# Patient Record
Sex: Male | Born: 1990 | Race: White | Hispanic: No | Marital: Single | Smoking: Never smoker
Health system: Southern US, Community
[De-identification: ages and names within clinical notes are randomized; demographics above are authoritative.]

## PROBLEM LIST (undated history)

## (undated) DIAGNOSIS — K3580 Unspecified acute appendicitis: Secondary | ICD-10-CM

## (undated) DIAGNOSIS — S42001A Fracture of unspecified part of right clavicle, initial encounter for closed fracture: Secondary | ICD-10-CM

---
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
## 2013-09-24 ENCOUNTER — Ambulatory Visit (INDEPENDENT_AMBULATORY_CARE_PROVIDER_SITE_OTHER): Payer: BC Managed Care – PPO | Admitting: Physician Assistant

## 2013-09-24 ENCOUNTER — Encounter: Payer: Self-pay | Admitting: Physician Assistant

## 2013-09-24 VITALS — BP 100/78 | HR 72 | Temp 98.0°F | Resp 18 | Wt 291.0 lb

## 2013-09-24 DIAGNOSIS — R109 Unspecified abdominal pain: Secondary | ICD-10-CM

## 2013-09-24 LAB — BASIC METABOLIC PANEL
BUN: 9 mg/dL (ref 6–23)
CO2: 29 mEq/L (ref 19–32)
Calcium: 9.7 mg/dL (ref 8.4–10.5)
Chloride: 101 mEq/L (ref 96–112)
Creatinine, Ser: 0.6 mg/dL (ref 0.4–1.5)
GFR: 175.49 mL/min (ref >60.00)
Glucose, Bld: 95 mg/dL (ref 70–99)
Potassium: 4.9 mEq/L (ref 3.5–5.1)
Sodium: 138 mEq/L (ref 135–145)

## 2013-09-24 LAB — CBC WITH DIFFERENTIAL/PLATELET
Basophils Absolute: 0.1 10*3/uL (ref 0.0–0.1)
Basophils Relative: 0.8 % (ref 0.0–3.0)
Eosinophils Absolute: 0.3 10*3/uL (ref 0.0–0.7)
Eosinophils Relative: 3.5 % (ref 0.0–5.0)
HCT: 46.1 % (ref 39.0–52.0)
Hemoglobin: 15.4 g/dL (ref 13.0–17.0)
Lymphocytes Relative: 20.4 % (ref 12.0–46.0)
Lymphs Abs: 1.8 10*3/uL (ref 0.7–4.0)
MCHC: 33.4 g/dL (ref 30.0–36.0)
MCV: 92.3 fl (ref 78.0–100.0)
Monocytes Absolute: 1 10*3/uL (ref 0.1–1.0)
Monocytes Relative: 11.3 % (ref 3.0–12.0)
Neutro Abs: 5.7 10*3/uL (ref 1.4–7.7)
Neutrophils Relative %: 64 % (ref 43.0–77.0)
Platelets: 386 10*3/uL (ref 150.0–400.0)
RBC: 4.99 Mil/uL (ref 4.22–5.81)
RDW: 13.4 % (ref 11.5–15.5)
WBC: 9 10*3/uL (ref 4.0–10.5)

## 2013-09-24 LAB — POCT URINALYSIS DIPSTICK
Bilirubin, UA: NEGATIVE
Blood, UA: NEGATIVE
Glucose, UA: NEGATIVE
Ketones, UA: NEGATIVE
Leukocytes, UA: NEGATIVE
Nitrite, UA: NEGATIVE
Spec Grav, UA: 1.025
Urobilinogen, UA: 0.2
pH, UA: 7

## 2013-09-24 LAB — SEDIMENTATION RATE: Sed Rate: 28 mm/hr — ABNORMAL HIGH (ref 0–22)

## 2013-09-24 NOTE — Progress Notes (Signed)
Pre visit review using our clinic review tool, if applicable. No additional management support is needed unless otherwise documented below in the visit note. 

## 2013-09-24 NOTE — Patient Instructions (Signed)
We will call with the results of your labwork when available.  Maintain fluid hydration.  Try a bland diet with bananas, rice, applesauce, toast. This will help cool off your abdomen and hopefully improve her diarrhea symptoms.  If emergency symptoms discussed during visit developed, seek medical attention immediately.  Followup as needed, or for worsening or persistent symptoms despite treatment.    Food Choices to Help Relieve Diarrhea When you have diarrhea, the foods you eat and your eating habits are very important. Choosing the right foods and drinks can help relieve diarrhea. Also, because diarrhea can last up to 7 days, you need to replace lost fluids and electrolytes (such as sodium, potassium, and chloride) in order to help prevent dehydration.  WHAT GENERAL GUIDELINES DO I NEED TO FOLLOW?  Slowly drink 1 cup (8 oz) of fluid for each episode of diarrhea. If you are getting enough fluid, your urine will be clear or pale yellow.  Eat starchy foods. Some good choices include white rice, white toast, pasta, low-fiber cereal, baked potatoes (without the skin), saltine crackers, and bagels.  Avoid large servings of any cooked vegetables.  Limit fruit to two servings per day. A serving is  cup or 1 small piece.  Choose foods with less than 2 g of fiber per serving.  Limit fats to less than 8 tsp (38 g) per day.  Avoid fried foods.  Eat foods that have probiotics in them. Probiotics can be found in certain dairy products.  Avoid foods and beverages that may increase the speed at which food moves through the stomach and intestines (gastrointestinal tract). Things to avoid include:  High-fiber foods, such as dried fruit, raw fruits and vegetables, nuts, seeds, and whole grain foods.  Spicy foods and high-fat foods.  Foods and beverages sweetened with high-fructose corn syrup, honey, or sugar alcohols such as xylitol, sorbitol, and mannitol. WHAT FOODS ARE  RECOMMENDED? Grains White rice. White, [place], or pita breads (fresh or toasted), including plain rolls, buns, or bagels. White pasta. Saltine, soda, or graham crackers. Pretzels. Low-fiber cereal. Cooked cereals made with water (such as cornmeal, farina, or cream cereals). Plain muffins. Matzo. Melba toast. Zwieback.  Vegetables Potatoes (without the skin). Strained tomato and vegetable juices. Most well-cooked and canned vegetables without seeds. Tender lettuce. Fruits Cooked or canned applesauce, apricots, cherries, fruit cocktail, grapefruit, peaches, pears, or plums. Fresh bananas, apples without skin, cherries, grapes, cantaloupe, grapefruit, peaches, oranges, or plums.  Meat and Other Protein Products Baked or boiled chicken. Eggs. Tofu. Fish. Seafood. Smooth peanut butter. Ground or well-cooked tender beef, ham, veal, lamb, pork, or poultry.  Dairy Plain yogurt, kefir, and unsweetened liquid yogurt. Lactose-free milk, buttermilk, or soy milk. Plain hard cheese. Beverages Sport drinks. Clear broths. Diluted fruit juices (except prune). Regular, caffeine-free sodas such as ginger ale. Water. Decaffeinated teas. Oral rehydration solutions. Sugar-free beverages not sweetened with sugar alcohols. Other Bouillon, broth, or soups made from recommended foods.  The items listed above may not be a complete list of recommended foods or beverages. Contact your dietitian for more options. WHAT FOODS ARE NOT RECOMMENDED? Grains Whole grain, whole wheat, bran, or rye breads, rolls, pastas, crackers, and cereals. Wild or brown rice. Cereals that contain more than 2 g of fiber per serving. Corn tortillas or taco shells. Cooked or dry oatmeal. Granola. Popcorn. Vegetables Raw vegetables. Cabbage, broccoli, Brussels sprouts, artichokes, baked beans, beet greens, corn, kale, legumes, peas, sweet potatoes, and yams. Potato skins. Cooked spinach and cabbage. Fruits Dried fruit,  including raisins and dates.  Raw fruits. Stewed or dried prunes. Fresh apples with skin, apricots, mangoes, pears, raspberries, and strawberries.  Meat and Other Protein Products Chunky peanut butter. Nuts and seeds. Beans and lentils. Berniece Salines.  Dairy High-fat cheeses. Milk, chocolate milk, and beverages made with milk, such as milk shakes. Cream. Ice cream. Sweets and Desserts Sweet rolls, doughnuts, and sweet breads. Pancakes and waffles. Fats and Oils Butter. Cream sauces. Margarine. Salad oils. Plain salad dressings. Olives. Avocados.  Beverages Caffeinated beverages (such as coffee, tea, soda, or energy drinks). Alcoholic beverages. Fruit juices with pulp. Prune juice. Soft drinks sweetened with high-fructose corn syrup or sugar alcohols. Other Coconut. Hot sauce. Chili powder. Mayonnaise. Gravy. Cream-based or milk-based soups.  The items listed above may not be a complete list of foods and beverages to avoid. Contact your dietitian for more information. WHAT SHOULD I DO IF I BECOME DEHYDRATED? Diarrhea can sometimes lead to dehydration. Signs of dehydration include dark urine and dry mouth and skin. If you think you are dehydrated, you should rehydrate with an oral rehydration solution. These solutions can be purchased at pharmacies, retail stores, or online.  Drink -1 cup (120-240 mL) of oral rehydration solution each time you have an episode of diarrhea. If drinking this amount makes your diarrhea worse, try drinking smaller amounts more often. For example, drink 1-3 tsp (5-15 mL) every 5-10 minutes.  A general rule for staying hydrated is to drink 1-2 L of fluid per day. Talk to your health care provider about the specific amount you should be drinking each day. Drink enough fluids to keep your urine clear or pale yellow. Document Released: 05/27/2003 Document Revised: 03/11/2013 Document Reviewed: 01/27/2013 [facility] Patient Information 2015 [place], [place]. This information is not intended to replace advice given  to you by your health care provider. Make sure you discuss any questions you have with your health care provider. Abdominal Pain Many things can cause belly (abdominal) pain. Most times, the belly pain is not dangerous. Many cases of belly pain can be watched and treated at home. HOME CARE   Do not take medicines that help you go poop (laxatives) unless told to by your doctor.  Only take medicine as told by your doctor.  Eat or drink as told by your doctor. Your doctor will tell you if you should be on a special diet. GET HELP IF:  You do not know what is causing your belly pain.  You have belly pain while you are sick to your stomach (nauseous) or have runny poop (diarrhea).  You have pain while you pee or poop.  Your belly pain wakes you up at night.  You have belly pain that gets worse or better when you eat.  You have belly pain that gets worse when you eat fatty foods.  You have a fever. GET HELP RIGHT AWAY IF:   The pain does not go away within 2 hours.  You keep throwing up (vomiting).  The pain changes and is only in the right or left part of the belly.  You have bloody or tarry looking poop. MAKE SURE YOU:   Understand these instructions.  Will watch your condition.  Will get help right away if you are not doing well or get worse. Document Released: 08/23/2007 Document Revised: 03/11/2013 Document Reviewed: 11/13/2012 [facility] Patient Information 2015 [place], [place]. This information is not intended to replace advice given to you by your health care provider. Make sure you discuss any questions you  have with your health care provider.  

## 2013-09-24 NOTE — Progress Notes (Signed)
Subjective:    Patient ID: Jesse Underwood, male    DOB: 11/24/1990, 23 y.o.   MRN: [id number]  Abdominal Pain This is a new problem. The current episode started in the past 7 days (3 or 4 days). The onset quality is sudden. The problem occurs 2 to 4 times per day. The problem has been gradually worsening. The pain is located in the RLQ. The pain is at a severity of 4/10. The quality of the pain is dull, aching and cramping. The abdominal pain does not radiate. Associated symptoms include diarrhea and myalgias. Pertinent negatives include no anorexia, arthralgias, belching, constipation, dysuria, fever, flatus, frequency, headaches, hematochezia, hematuria, melena, nausea, vomiting or weight loss. The pain is aggravated by movement and certain positions. The pain is relieved by palpation, certain positions and standing. Treatments tried: peptobismol. The treatment provided no relief. There is no history of abdominal surgery, colon cancer, Crohn's disease, gallstones, GERD, irritable bowel syndrome, pancreatitis, PUD or ulcerative colitis.  The pain is new, however he states that he has noticed a "bulge" in the area for several months.    Review of Systems  Constitutional: Negative for fever, chills and weight loss.  Respiratory: Negative for cough and shortness of breath.   Cardiovascular: Negative for chest pain.  Gastrointestinal: Positive for abdominal pain and diarrhea. Negative for nausea, vomiting, constipation, blood in stool, melena, hematochezia, abdominal distention, anal bleeding, anorexia and flatus.  Genitourinary: Negative for dysuria, frequency and hematuria.  Musculoskeletal: Positive for myalgias. Negative for arthralgias.  Neurological: Negative for headaches.  All other systems reviewed and are negative.   History reviewed. No pertinent past medical history.  History   Social History  . Marital Status: Single    Spouse Name: N/A    Number of Children: N/A  . Years of  Education: N/A   Occupational History  . Not on file.   Social History Main Topics  . Smoking status: Never Smoker   . Smokeless tobacco: Not on file  . Alcohol Use: No  . Drug Use: Not on file  . Sexual Activity: Not on file   Other Topics Concern  . Not on file   Social History Narrative  . No narrative on file    History reviewed. No pertinent past surgical history.  No family history on file.  No Known Allergies  No current outpatient prescriptions on file prior to visit.   No current facility-administered medications on file prior to visit.    EXAM: BP 100/78  Pulse 72  Temp(Src) 98 F (36.7 C) (Oral)  Resp 18  Wt 291 lb (131.997 kg)     Objective:   Physical Exam  Nursing note and vitals reviewed. Constitutional: He is oriented to person, place, and time. He appears well-developed and well-nourished. No distress.  HENT:  Head: Normocephalic and atraumatic.  Eyes: Conjunctivae and EOM are normal. Pupils are equal, round, and reactive to light.  Neck: Normal range of motion.  Cardiovascular: Normal rate, regular rhythm and intact distal pulses.   Pulmonary/Chest: Effort normal and breath sounds normal. No respiratory distress. He exhibits no tenderness.  Abdominal: Soft. Bowel sounds are normal. He exhibits no distension and no mass. There is tenderness (vague mild tenderness to deep palpation of the RLQ.). There is no rebound and no guarding.  There is a superficial varicosity in the RLQ, however not over the location of pain. This is not tender to palpation.  Genitourinary:  No direct or indirect inguinal hernia on exam.  Musculoskeletal: Normal range of motion.  Neurological: He is alert and oriented to person, place, and time.  Skin: Skin is warm and dry. No rash noted. He is not diaphoretic. No erythema. No pallor.  There is a superficial varicosity in the RLQ, however not over the location of pain. This is not tender to palpation, no erythema,  swelling, excessive warmth.  Psychiatric: He has a normal mood and affect. His behavior is normal. Judgment and thought content normal.       Lab Results  Component Value Date   WBC 8.8 09/13/2009   HGB 16.9 09/13/2009   HCT 47.9 09/13/2009   PLT 345.0 09/13/2009   GLUCOSE 72 09/13/2009   CHOL 180 09/13/2009   TRIG 103.0 09/13/2009   HDL 33.00* 09/13/2009   LDLCALC 126* 09/13/2009   ALT 99* 09/13/2009   AST 39* 09/13/2009   NA 143 09/13/2009   K 4.7 09/13/2009   CL 107 09/13/2009   CREATININE 0.7 09/13/2009   BUN 11 09/13/2009   CO2 28 09/13/2009   TSH 1.27 09/13/2009        Assessment & Plan:  Jahmal was seen today for right lower abdominal pain.  Diagnoses and associated orders for this visit:  Abdominal pain, other specified site Comments: RLQ. No peritoneal signs. Will draw labs and watchful waiting for now. - CBC with Differential - Sedimentation Rate - POC Urinalysis Dipstick - Basic Metabolic Panel    Watchful waiting. Will have pt use symptomatic at home therapy and bland diet to calm GI symptoms. Maintain fluid hydration.  The "bulge" that the pt was referring to was actually the varicosity, and was not located in the area of the tenderness. Pt will continue to monitor.  Return precautions provided, and patient handout on diet for diarrhea, abdominal pain.  Plan to follow up as needed, or for worsening or persistent symptoms despite treatment.  Patient Instructions  We will call with the results of your labwork when available.  Maintain fluid hydration.  Try a bland diet with bananas, rice, applesauce, toast. This will help cool off your abdomen and hopefully improve her diarrhea symptoms.  If emergency symptoms discussed during visit developed, seek medical attention immediately.  Followup as needed, or for worsening or persistent symptoms despite treatment.

## 2013-10-01 ENCOUNTER — Ambulatory Visit (INDEPENDENT_AMBULATORY_CARE_PROVIDER_SITE_OTHER): Payer: BC Managed Care – PPO | Admitting: Family Medicine

## 2013-10-01 ENCOUNTER — Ambulatory Visit (INDEPENDENT_AMBULATORY_CARE_PROVIDER_SITE_OTHER)
Admission: RE | Admit: 2013-10-01 | Discharge: 2013-10-01 | Disposition: A | Discharge status: Home / Self Care | Payer: BC Managed Care – PPO | Source: Ambulatory Visit | Attending: Family Medicine | Admitting: Family Medicine

## 2013-10-01 ENCOUNTER — Ambulatory Visit: Payer: Self-pay | Admitting: Physician Assistant

## 2013-10-01 ENCOUNTER — Encounter: Payer: Self-pay | Admitting: Family Medicine

## 2013-10-01 ENCOUNTER — Observation Stay (HOSPITAL_COMMUNITY)
Admission: EM | Admit: 2013-10-01 | Discharge: 2013-10-03 | Disposition: A | Discharge status: Home / Self Care | Payer: BC Managed Care – PPO | Attending: Surgery | Admitting: Surgery

## 2013-10-01 ENCOUNTER — Encounter (HOSPITAL_COMMUNITY): Payer: Self-pay | Admitting: Emergency Medicine

## 2013-10-01 VITALS — BP 114/81 | HR 79 | Temp 98.2°F | Ht 67.0 in | Wt 289.0 lb

## 2013-10-01 DIAGNOSIS — Z6841 Body Mass Index (BMI) 40.0 and over, adult: Secondary | ICD-10-CM | POA: Insufficient documentation

## 2013-10-01 DIAGNOSIS — R1031 Right lower quadrant pain: Secondary | ICD-10-CM

## 2013-10-01 DIAGNOSIS — K3580 Unspecified acute appendicitis: Principal | ICD-10-CM | POA: Diagnosis present

## 2013-10-01 DIAGNOSIS — R933 Abnormal findings on diagnostic imaging of other parts of digestive tract: Secondary | ICD-10-CM

## 2013-10-01 DIAGNOSIS — R109 Unspecified abdominal pain: Secondary | ICD-10-CM | POA: Diagnosis present

## 2013-10-01 HISTORY — DX: Unspecified acute appendicitis: K35.80

## 2013-10-01 LAB — CBC WITH DIFFERENTIAL/PLATELET
BASOS ABS: 0.1 10*3/uL (ref 0.0–0.1)
Basophils Relative: 0 % (ref 0–1)
EOS ABS: 0.1 10*3/uL (ref 0.0–0.7)
EOS PCT: 1 % (ref 0–5)
HCT: 44.8 % (ref 39.0–52.0)
Hemoglobin: 16.1 g/dL (ref 13.0–17.0)
Lymphocytes Relative: 10 % — ABNORMAL LOW (ref 12–46)
Lymphs Abs: 1.6 10*3/uL (ref 0.7–4.0)
MCH: 33.1 pg (ref 26.0–34.0)
MCHC: 35.9 g/dL (ref 30.0–36.0)
MCV: 92.2 fL (ref 78.0–100.0)
Monocytes Absolute: 1.6 10*3/uL — ABNORMAL HIGH (ref 0.1–1.0)
Monocytes Relative: 10 % (ref 3–12)
Neutro Abs: 11.8 10*3/uL — ABNORMAL HIGH (ref 1.7–7.7)
Neutrophils Relative %: 79 % — ABNORMAL HIGH (ref 43–77)
Platelets: 347 10*3/uL (ref 150–400)
RBC: 4.86 MIL/uL (ref 4.22–5.81)
RDW: 12.7 % (ref 11.5–15.5)
WBC: 15.1 10*3/uL — AB (ref 4.0–10.5)

## 2013-10-01 LAB — URINE MICROSCOPIC-ADD ON

## 2013-10-01 LAB — COMPREHENSIVE METABOLIC PANEL
ALBUMIN: 3.8 g/dL (ref 3.5–5.2)
ALK PHOS: 76 U/L (ref 39–117)
ALT: 45 U/L (ref 0–53)
AST: 20 U/L (ref 0–37)
Anion gap: 16 — ABNORMAL HIGH (ref 5–15)
BUN: 6 mg/dL (ref 6–23)
CALCIUM: 9.8 mg/dL (ref 8.4–10.5)
CO2: 25 mEq/L (ref 19–32)
Chloride: 98 mEq/L (ref 96–112)
Creatinine, Ser: 0.6 mg/dL (ref 0.50–1.35)
GFR calc Af Amer: >90 mL/min (ref >90)
GFR calc non Af Amer: >90 mL/min (ref >90)
Glucose, Bld: 91 mg/dL (ref 70–99)
POTASSIUM: 4.8 meq/L (ref 3.7–5.3)
Sodium: 139 mEq/L (ref 137–147)
Total Bilirubin: 0.6 mg/dL (ref 0.3–1.2)
Total Protein: 8 g/dL (ref 6.0–8.3)

## 2013-10-01 LAB — URINALYSIS, ROUTINE W REFLEX MICROSCOPIC
BILIRUBIN URINE: NEGATIVE
GLUCOSE, UA: NEGATIVE mg/dL
KETONES UR: 40 mg/dL — AB
Leukocytes, UA: NEGATIVE
Nitrite: NEGATIVE
PH: 5.5 (ref 5.0–8.0)
Protein, ur: NEGATIVE mg/dL
SPECIFIC GRAVITY, URINE: 1.025 (ref 1.005–1.030)
Urobilinogen, UA: 0.2 mg/dL (ref 0.0–1.0)

## 2013-10-01 LAB — LIPASE, BLOOD: Lipase: 19 U/L (ref 11–59)

## 2013-10-01 MED ORDER — MORPHINE SULFATE 4 MG/ML IJ SOLN
4.0000 mg | Freq: Once | INTRAMUSCULAR | Status: AC
  Administered 2013-10-01: 4 mg via INTRAVENOUS
  Filled 2013-10-01: qty 1

## 2013-10-01 MED ORDER — PIPERACILLIN-TAZOBACTAM 3.375 G IVPB
3.3750 g | Freq: Three times a day (TID) | INTRAVENOUS | Status: DC
  Administered 2013-10-01 – 2013-10-03 (×5): 3.375 g via INTRAVENOUS
  Filled 2013-10-01 (×8): qty 50

## 2013-10-01 MED ORDER — DIPHENHYDRAMINE HCL 12.5 MG/5ML PO ELIX: 12.5000 mg | ORAL_SOLUTION | Freq: Four times a day (QID) | ORAL | Status: DC | PRN

## 2013-10-01 MED ORDER — ONDANSETRON HCL 4 MG/2ML IJ SOLN
4.0000 mg | Freq: Four times a day (QID) | INTRAMUSCULAR | Status: DC | PRN
  Administered 2013-10-01 – 2013-10-02 (×2): 4 mg via INTRAVENOUS
  Filled 2013-10-01 (×2): qty 2

## 2013-10-01 MED ORDER — ACETAMINOPHEN 650 MG RE SUPP: 650.0000 mg | Freq: Four times a day (QID) | RECTAL | Status: DC | PRN

## 2013-10-01 MED ORDER — ACETAMINOPHEN 325 MG PO TABS: 650.0000 mg | ORAL_TABLET | Freq: Four times a day (QID) | ORAL | Status: DC | PRN

## 2013-10-01 MED ORDER — MORPHINE SULFATE 2 MG/ML IJ SOLN
2.0000 mg | INTRAMUSCULAR | Status: DC | PRN
  Administered 2013-10-01: 4 mg via INTRAVENOUS
  Administered 2013-10-01: 2 mg via INTRAVENOUS
  Administered 2013-10-01 – 2013-10-03 (×5): 4 mg via INTRAVENOUS
  Administered 2013-10-03: 2 mg via INTRAVENOUS
  Filled 2013-10-01: qty 2
  Filled 2013-10-01: qty 1
  Filled 2013-10-01 (×5): qty 2
  Filled 2013-10-01: qty 1

## 2013-10-01 MED ORDER — SODIUM CHLORIDE 0.9 % IV BOLUS (SEPSIS)
1000.0000 mL | Freq: Once | INTRAVENOUS | Status: AC
  Administered 2013-10-01: 1000 mL via INTRAVENOUS

## 2013-10-01 MED ORDER — HEPARIN SODIUM (PORCINE) 5000 UNIT/ML IJ SOLN
5000.0000 [IU] | Freq: Three times a day (TID) | INTRAMUSCULAR | Status: DC
Start: 2013-10-01 — End: 2013-10-03
  Administered 2013-10-01 – 2013-10-03 (×3): 5000 [IU] via SUBCUTANEOUS
  Filled 2013-10-01 (×7): qty 1

## 2013-10-01 MED ORDER — DIPHENHYDRAMINE HCL 50 MG/ML IJ SOLN: 12.5000 mg | Freq: Four times a day (QID) | INTRAMUSCULAR | Status: DC | PRN

## 2013-10-01 MED ORDER — POTASSIUM CHLORIDE IN NACL 20-0.9 MEQ/L-% IV SOLN
INTRAVENOUS | Status: DC
  Administered 2013-10-01 – 2013-10-02 (×3): via INTRAVENOUS
  Filled 2013-10-01 (×7): qty 1000

## 2013-10-01 MED ORDER — ONDANSETRON HCL 4 MG/2ML IJ SOLN
4.0000 mg | Freq: Once | INTRAMUSCULAR | Status: AC
  Administered 2013-10-01: 4 mg via INTRAVENOUS
  Filled 2013-10-01: qty 2

## 2013-10-01 MED ORDER — IOHEXOL 300 MG/ML  SOLN
100.0000 mL | Freq: Once | INTRAMUSCULAR | Status: AC | PRN
  Administered 2013-10-01: 100 mL via INTRAVENOUS

## 2013-10-01 NOTE — Progress Notes (Signed)
Patient ID: Jesse Underwood, male   DOB: 01-25-1991, 23 y.o.   MRN: [id number] I have seen and examined the patient and agree with the assessment and plans. It is difficult to tell whether this is actual appendicitis. On CAT scan, the appendix is fairly normal except for the tip which think is into the retroperitoneum there is a large inflammatory process with multiple prominent lymph nodes including one over 3 cm in size. His abdomen is soft with mild tenderness and guarding in the right lower quadrant. He has been having this discomfort for greater than a week. At this point, I believe it is reasonable to put him in the hospital for IV rehydration IV antibiotics. If he clinically improves, I would consider a repeat CAT scan at a later date to make sure this is not some other process like Crohn's or lymphoma. If he is a clinically improved, he may need a diagnostic laparoscopy and possible appendectomy.   Yicel Shannon A. Ninfa Linden  MD, FACS

## 2013-10-01 NOTE — ED Notes (Signed)
Reports generalized abd pain and nausea. Denies vomiting. Has been to pcp office and sent today for ct scan, sent here for probable appendicitis.

## 2013-10-01 NOTE — ED Notes (Signed)
MD at bedside.-Dr. Knapp 

## 2013-10-01 NOTE — ED Provider Notes (Signed)
CSN: [id number]     Arrival date & time 10/01/13  1455 History   First MD Initiated Contact with Patient 10/01/13 1537     Chief Complaint  Patient presents with  . Abdominal Pain     (Consider location/radiation/quality/duration/timing/severity/associated sxs/prior Treatment) HPI  Patient to the ED by PCP office for CT scan to r/o appendicitis. He developed abdominal pains 1 week ago and saw his PCP who drew lab work but they were unremarkable. Yesterday and today the symptoms worsened and changed in severity and quality. He saw his PCP Dr. Alysia Penna again today who ordered a CT abd pelv w/ contrast which showed concern for acute appendicitis and was recommended to come to the ED. At this time the patient is not in severe pain and his VSS. He has been nauseous with but denies vomiting, bloody diarrhea or any urinary symptoms.  Filed Vitals:   10/01/13 1630  BP: 143/71  Pulse: 75  Temp:   Resp:      History reviewed. No pertinent past medical history. History reviewed. No pertinent past surgical history. History reviewed. No pertinent family history. History  Substance Use Topics  . Smoking status: Never Smoker   . Smokeless tobacco: Never Used  . Alcohol Use: Yes     Comment: 5-6 beers, 2 times a week    Review of Systems   Review of Systems  Gen: no weight loss, fevers, chills, night sweats  Eyes: no discharge or drainage, no occular pain or visual changes  Nose: no epistaxis or rhinorrhea  Mouth: no dental pain, no sore throat  Neck: no neck pain  Lungs:No wheezing, coughing or hemoptysis CV: no chest pain, palpitations, dependent edema or orthopnea  Abd: + abdominal pain, nausea,, diarrhea No vomiting GU: no dysuria or gross hematuria  MSK:  No muscle weakness or pain Neuro: no headache, no focal neurologic deficits  Skin: no rash or wounds Psyche: no complaints    Allergies  Review of patient's allergies indicates no known allergies.  Home Medications     Prior to Admission medications   Medication Sig Start Date End Date Taking? Authorizing Provider  senna (SENOKOT) 8.6 MG TABS tablet Take 1 tablet by mouth daily as needed for mild constipation.   Yes Historical Provider, MD  simethicone (MYLICON) 80 MG chewable tablet Chew 80 mg by mouth every 6 (six) hours as needed (for gas relief).   Yes Historical Provider, MD   BP 143/71  Pulse 75  Temp(Src) 98 F (36.7 C) (Oral)  Resp 17  SpO2 97% Physical Exam  Nursing note and vitals reviewed. Constitutional: He appears well-developed and well-nourished. No distress.  HENT:  Head: Normocephalic and atraumatic.  Eyes: Pupils are equal, round, and reactive to light.  Neck: Normal range of motion. Neck supple.  Cardiovascular: Normal rate and regular rhythm.   Pulmonary/Chest: Effort normal.  Abdominal: Soft. Bowel sounds are normal. He exhibits no distension, no fluid wave and no ascites. There is no hepatosplenomegaly. There is tenderness. There is no rigidity, no rebound, no guarding, no CVA tenderness and negative Murphy's sign.    Neurological: He is alert.  Skin: Skin is warm and dry.    ED Course  Procedures (including critical care time) Labs Review Labs Reviewed  CBC WITH DIFFERENTIAL - Abnormal; Notable for the following:    WBC 15.1 (*)    Neutrophils Relative % 79 (*)    Neutro Abs 11.8 (*)    Lymphocytes Relative 10 (*)  Monocytes Absolute 1.6 (*)    All other components within normal limits  COMPREHENSIVE METABOLIC PANEL - Abnormal; Notable for the following:    Anion gap 16 (*)    All other components within normal limits  LIPASE, BLOOD  URINALYSIS, ROUTINE W REFLEX MICROSCOPIC  CBC  CREATININE, SERUM    Imaging Review Ct Abdomen Pelvis W Contrast  10/01/2013   CLINICAL DATA:  Right lower quadrant pain with lower abdominal distension intermittent diarrhea and constipation next filled.  EXAM: CT ABDOMEN AND PELVIS WITH CONTRAST  TECHNIQUE: Multidetector CT  imaging of the abdomen and pelvis was performed using the standard protocol following bolus administration of intravenous contrast.  CONTRAST:  138mL OMNIPAQUE IOHEXOL 300 MG/ML SOLN intravenously ; the patient also received oral contrast material.  COMPARISON:  None.  FINDINGS: There are inflammatory changes of the right lower quadrant of the abdomen. This is closely applied to the appendix which is not markedly edematous. There is no appendicolith. The posterior aspect of the distal portion of the appendix is closely applied to the inflammatory changes. There is no discrete abscess nor evidence of free air. Inflammatory changes extend proximally along the mesenteric root and there are multiple enlarged mesenteric lymph nodes and retroperitoneal lymph nodes. There are prominent mesenteric vessels.  The transverse portion of duodenum exhibits minimal wall thickening andis at the superior extent of the inflammatory changes. The remaining bowel loops exhibit no evidence of inflammation, obstruction, or ileus. The terminal ileum is unremarkable. Contrast has just reached the cecum. The stool and gas pattern within the colon is unremarkable.  The liver, gallbladder, pancreas, spleen, stomach, adrenal glands, and kidneys exhibit no acute abnormalities. There is minimal prominence of the right renal collecting system and proximal ureter. The mid ureter becomes obscured by inflammatory changes in the adjacent soft tissues. There are punctate right adrenal calcifications. The urinary bladder and prostate gland are normal. There is no free pelvic fluid. There is no inguinal nor umbilical hernia.  The lumbar spine and bony pelvis are unremarkable. The lung bases are clear.  IMPRESSION: 1. There are inflammatory changes in the right lower quadrant of the abdomen. There is no abscess or free fluid but there are is lymphadenopathy in the right lower quadrant and retroperitoneum. There is also prominence of the mesenteric  vasculature in this region. This is likely secondary to atypical acute appendicitis, but Crohn's disease, lymphoma, and granulomatous infectious are in the differential. 2. There is no small or large bowel obstruction or ileus. There is no acute colitis. 3. There is no acute hepatobiliary nor acute urinary tract abnormality. Minimal prominence of the right intrarenal collecting system and proximal ureter likely reflects involvement of the mid right ureter by the retroperitoneal inflammatory process. 4. These results were called by telephone at the time of interpretation on 10/01/2013 at 2:43 pm to Dr. Alysia Penna , who verbally acknowledged these results.   Electronically Signed   By: David  Martinique   On: 10/01/2013 14:49     EKG Interpretation None      MDM   Final diagnoses:  Right lower quadrant abdominal pain    Dr. Rolland Porter has seen patient as well. General surgery has been consulted and they feel the patient needs to be admitted and have antibiotics. Pt admitted to Gen Surg, will give abx and monitor.Admission orders placed by general surgery. Pain controlled in the ED.  Filed Vitals:   10/02/13 1533  BP: 134/77  Pulse:   Temp: 98.6 F (37  C)  Resp: [place] Elica Almas, PA-C 10/02/13 2007

## 2013-10-01 NOTE — Progress Notes (Signed)
Pre visit review using our clinic review tool, if applicable. No additional management support is needed unless otherwise documented below in the visit note. 

## 2013-10-01 NOTE — ED Notes (Signed)
Attempt to call report.

## 2013-10-01 NOTE — H&P (Signed)
Jesse Underwood 1990/11/22  [id number].   Primary Care MD: Dr. Alysia Penna Chief Complaint/Reason for Consult: Abdominal pain HPI: This is a 23 year old otherwise healthy white male who presented to [facility] emergency department today after a CT scan by his primary care physician. Over a week ago the patient developed some vague right lower part of abdominal pain. It was of gradual onset. It started in the right lower quadrant and did not radiate anywhere. He denies any nausea or vomiting. He admits to some intermittent diarrhea and constipation over the last couple of days. He initially saw his primary care physician last week. Labs were drawn which were normal. He was treated conservatively at that time. He returned to his primary care doctor's office today to do continued pain. He was sent for a CT scan of his abdomen and pelvis. This revealed inflammatory changes in the right lower quadrant with no abscess or free fluid seen. He did have lymphadenopathy in the right lower quadrant and retroperitoneum. He had a prominence of his mesenteric vasculature in this region. Differentials include atypical appendicitis, Crohn's disease, lymphoma, or granulomatous infection. There is even some mild prominence of his right intrarenal collecting system and proximal ureter secondary to the retroperitoneal inflammatory process. We have been asked to evaluate the patient for admission.  ROS please see history of present illness, otherwise all other systems have been reviewed and are negative.  History reviewed. No pertinent family history. no family history of Crohn's disease, inflammatory bowel disease, lymphoma, cancer of the bowel  History reviewed. No pertinent past medical history.  History reviewed. No pertinent past surgical history.  Social History:  reports that he has never smoked. He has never used smokeless tobacco. He reports that he drinks alcohol. He reports that he uses illicit drugs  (Marijuana).  Allergies: No Known Allergies   (Not in a hospital admission)  Blood pressure 140/77, pulse 90, temperature 98 F (36.7 C), temperature source Oral, resp. rate 17, SpO2 98.00%. Physical Exam: General: pleasant, obese WD, WN white male who is laying in bed in NAD HEENT: head is normocephalic, atraumatic.  Sclera are noninjected.  PERRL.  Ears and nose without any masses or lesions.  Mouth is pink and moist Heart: regular, rate, and rhythm.  Normal s1,s2. No obvious murmurs, gallops, or rubs noted.  Palpable radial and pedal pulses bilaterally Lungs: CTAB, no wheezes, rhonchi, or rales noted.  Respiratory effort nonlabored Abd: soft, mildly tender in the right lower quadrant with minimal voluntary guarding, no rebound or peritonitis, ND, +BS, no masses, hernias, or organomegaly Lymph: No inguinal or supraclavicular or cervical lymphadenopathy is noted MS: all 4 extremities are symmetrical with no cyanosis, clubbing, or edema. Skin: warm and dry with no masses, lesions, or rashes Psych: A&Ox3 with an appropriate affect.    Results for orders placed during the hospital encounter of 10/01/13 (from the past 48 hour(s))  CBC WITH DIFFERENTIAL     Status: Abnormal   Collection Time    10/01/13  3:14 PM      Result Value Ref Range   WBC 15.1 (*) 4.0 - 10.5 K/uL   RBC 4.86  4.22 - 5.81 MIL/uL   Hemoglobin 16.1  13.0 - 17.0 g/dL   HCT 44.8  39.0 - 52.0 %   MCV 92.2  78.0 - 100.0 fL   MCH 33.1  26.0 - 34.0 pg   MCHC 35.9  30.0 - 36.0 g/dL   RDW 12.7  11.5 - 15.5 %  Platelets 347  150 - 400 K/uL   Neutrophils Relative % 79 (*) 43 - 77 %   Neutro Abs 11.8 (*) 1.7 - 7.7 K/uL   Lymphocytes Relative 10 (*) 12 - 46 %   Lymphs Abs 1.6  0.7 - 4.0 K/uL   Monocytes Relative 10  3 - 12 %   Monocytes Absolute 1.6 (*) 0.1 - 1.0 K/uL   Eosinophils Relative 1  0 - 5 %   Eosinophils Absolute 0.1  0.0 - 0.7 K/uL   Basophils Relative 0  0 - 1 %   Basophils Absolute 0.1  0.0 - 0.1 K/uL   COMPREHENSIVE METABOLIC PANEL     Status: Abnormal   Collection Time    10/01/13  3:14 PM      Result Value Ref Range   Sodium 139  137 - 147 mEq/L   Potassium 4.8  3.7 - 5.3 mEq/L   Chloride 98  96 - 112 mEq/L   CO2 25  19 - 32 mEq/L   Glucose, Bld 91  70 - 99 mg/dL   BUN 6  6 - 23 mg/dL   Creatinine, Ser 0.60  0.50 - 1.35 mg/dL   Calcium 9.8  8.4 - 10.5 mg/dL   Total Protein 8.0  6.0 - 8.3 g/dL   Albumin 3.8  3.5 - 5.2 g/dL   AST 20  0 - 37 U/L   ALT 45  0 - 53 U/L   Alkaline Phosphatase 76  39 - 117 U/L   Total Bilirubin 0.6  0.3 - 1.2 mg/dL   GFR calc non Af Amer >90  >90 mL/min   GFR calc Af Amer >90  >90 mL/min   Comment: (NOTE)     The eGFR has been calculated using the CKD EPI equation.     This calculation has not been validated in all clinical situations.     eGFR's persistently <90 mL/min signify possible Chronic Kidney     Disease.   Anion gap 16 (*) 5 - 15  LIPASE, BLOOD     Status: None   Collection Time    10/01/13  3:14 PM      Result Value Ref Range   Lipase 19  11 - 59 U/L   Ct Abdomen Pelvis W Contrast  10/01/2013   CLINICAL DATA:  Right lower quadrant pain with lower abdominal distension intermittent diarrhea and constipation next filled.  EXAM: CT ABDOMEN AND PELVIS WITH CONTRAST  TECHNIQUE: Multidetector CT imaging of the abdomen and pelvis was performed using the standard protocol following bolus administration of intravenous contrast.  CONTRAST:  124m OMNIPAQUE IOHEXOL 300 MG/ML SOLN intravenously ; the patient also received oral contrast material.  COMPARISON:  None.  FINDINGS: There are inflammatory changes of the right lower quadrant of the abdomen. This is closely applied to the appendix which is not markedly edematous. There is no appendicolith. The posterior aspect of the distal portion of the appendix is closely applied to the inflammatory changes. There is no discrete abscess nor evidence of free air. Inflammatory changes extend proximally along the  mesenteric root and there are multiple enlarged mesenteric lymph nodes and retroperitoneal lymph nodes. There are prominent mesenteric vessels.  The transverse portion of duodenum exhibits minimal wall thickening andis at the superior extent of the inflammatory changes. The remaining bowel loops exhibit no evidence of inflammation, obstruction, or ileus. The terminal ileum is unremarkable. Contrast has just reached the cecum. The stool and gas pattern within the colon  is unremarkable.  The liver, gallbladder, pancreas, spleen, stomach, adrenal glands, and kidneys exhibit no acute abnormalities. There is minimal prominence of the right renal collecting system and proximal ureter. The mid ureter becomes obscured by inflammatory changes in the adjacent soft tissues. There are punctate right adrenal calcifications. The urinary bladder and prostate gland are normal. There is no free pelvic fluid. There is no inguinal nor umbilical hernia.  The lumbar spine and bony pelvis are unremarkable. The lung bases are clear.  IMPRESSION: 1. There are inflammatory changes in the right lower quadrant of the abdomen. There is no abscess or free fluid but there are is lymphadenopathy in the right lower quadrant and retroperitoneum. There is also prominence of the mesenteric vasculature in this region. This is likely secondary to atypical acute appendicitis, but Crohn's disease, lymphoma, and granulomatous infectious are in the differential. 2. There is no small or large bowel obstruction or ileus. There is no acute colitis. 3. There is no acute hepatobiliary nor acute urinary tract abnormality. Minimal prominence of the right intrarenal collecting system and proximal ureter likely reflects involvement of the mid right ureter by the retroperitoneal inflammatory process. 4. These results were called by telephone at the time of interpretation on 10/01/2013 at 2:43 pm to Dr. Alysia Penna , who verbally acknowledged these results.    Electronically Signed   By: David  Martinique   On: 10/01/2013 14:49       Assessment/Plan 1. Right lower quadrant inflammatory process, unknown etiology  Plan: 1. The patient's CT scan is not straightforward. This is not definitively an appendicitis. Given his findings, we will get the patient admitted for IV antibiotic therapy. We'll give him clear liquids tonight and n.p.o. after midnight in case he does not improve or worsens he may need a diagnostic laparoscopy in the morning. Followup lab work will be obtained in the morning as well. The patient and his family member understand and agree with this plan.  Elzina Devera E 10/01/2013, 4:32 PM Pager: [phone]

## 2013-10-01 NOTE — Progress Notes (Signed)
   Subjective:    Patient ID: Jesse Underwood, male    DOB: 05/16/1990, 23 y.o.   MRN: [id number]  HPI Here for 12 days of intermittent RLQ pain which can be severe at times. It does not radiate. He feels a little bloated and constipated, but his last BM was last night. Today he is nauseated for the first time but has not vomited. No fever. No urinary sx. He was here a few days ago and had labs which were normal, including a CBC. He has taken some Gas Relief and a laxative with no benefit.    Review of Systems  Constitutional: Negative.   Respiratory: Negative.   Cardiovascular: Negative.   Gastrointestinal: Positive for nausea, abdominal pain, constipation and abdominal distention. Negative for vomiting, diarrhea, blood in stool, anal bleeding and rectal pain.  Genitourinary: Negative.        Objective:   Physical Exam  Constitutional: He appears well-developed and well-nourished. No distress.  Pulmonary/Chest: Effort normal and breath sounds normal.  Abdominal: Soft. Bowel sounds are normal. He exhibits no distension and no mass. There is tenderness. There is rebound and guarding.  He is tender in the RLQ with some guarding and some rebound  Genitourinary: Rectum normal.  No tenderness, no stool is present           Assessment & Plan:  I am concerned about possible appendicitis, so we will send him for a stat contrasted CT of the abdomen and pelvis.

## 2013-10-01 NOTE — ED Notes (Signed)
MD at bedside. Dr Blackmon 

## 2013-10-01 NOTE — ED Provider Notes (Signed)
Pt reports RLQ pain that started a week ago and is now diffuse. He states walking makes the pain worse. He has loss of appetite today.  He has had nausea without vomiting. He denies fever.  Patient has no pain to knee tapping. He has mild discomfort with psoas sign. He has diffuse generalized abdominal discomfort.  Medical screening examination/treatment/procedure(s) were conducted as a shared visit with non-physician practitioner(s) and myself.  I personally evaluated the patient during the encounter.   EKG Interpretation None       Rolland Porter, MD, Abram Sander   Janice Norrie, MD 10/01/13 1626

## 2013-10-02 ENCOUNTER — Encounter (HOSPITAL_COMMUNITY): Payer: BC Managed Care – PPO | Admitting: Anesthesiology

## 2013-10-02 ENCOUNTER — Observation Stay (HOSPITAL_COMMUNITY): Payer: BC Managed Care – PPO | Admitting: Anesthesiology

## 2013-10-02 ENCOUNTER — Encounter (HOSPITAL_COMMUNITY)
Admission: EM | Disposition: A | Discharge status: Home / Self Care | Payer: Self-pay | Source: Home / Self Care | Attending: Emergency Medicine

## 2013-10-02 ENCOUNTER — Encounter (HOSPITAL_COMMUNITY): Payer: Self-pay | Admitting: Anesthesiology

## 2013-10-02 DIAGNOSIS — K389 Disease of appendix, unspecified: Secondary | ICD-10-CM

## 2013-10-02 HISTORY — PX: LAPAROSCOPY: SHX197

## 2013-10-02 HISTORY — PX: LAPAROSCOPIC APPENDECTOMY: SHX408

## 2013-10-02 LAB — BASIC METABOLIC PANEL
Anion gap: 18 — ABNORMAL HIGH (ref 5–15)
BUN: 7 mg/dL (ref 6–23)
CO2: 22 meq/L (ref 19–32)
Calcium: 8.7 mg/dL (ref 8.4–10.5)
Chloride: 101 mEq/L (ref 96–112)
Creatinine, Ser: 0.65 mg/dL (ref 0.50–1.35)
GFR calc Af Amer: >90 mL/min (ref >90)
GFR calc non Af Amer: >90 mL/min (ref >90)
GLUCOSE: 95 mg/dL (ref 70–99)
Potassium: 3.8 mEq/L (ref 3.7–5.3)
SODIUM: 141 meq/L (ref 137–147)

## 2013-10-02 LAB — CBC
HCT: 42.9 % (ref 39.0–52.0)
Hemoglobin: 14.9 g/dL (ref 13.0–17.0)
MCH: 31.6 pg (ref 26.0–34.0)
MCHC: 34.7 g/dL (ref 30.0–36.0)
MCV: 90.9 fL (ref 78.0–100.0)
Platelets: 321 10*3/uL (ref 150–400)
RBC: 4.72 MIL/uL (ref 4.22–5.81)
RDW: 12.6 % (ref 11.5–15.5)
WBC: 13.4 10*3/uL — ABNORMAL HIGH (ref 4.0–10.5)

## 2013-10-02 LAB — SURGICAL PCR SCREEN
MRSA, PCR: NEGATIVE
Staphylococcus aureus: NEGATIVE

## 2013-10-02 SURGERY — LAPAROSCOPY, DIAGNOSTIC
Anesthesia: General | Site: Abdomen

## 2013-10-02 MED ORDER — FENTANYL CITRATE 0.05 MG/ML IJ SOLN
INTRAMUSCULAR | Status: DC | PRN
  Administered 2013-10-02: 150 ug via INTRAVENOUS
  Administered 2013-10-02: 100 ug via INTRAVENOUS

## 2013-10-02 MED ORDER — OXYCODONE-ACETAMINOPHEN 5-325 MG PO TABS
1.0000 | ORAL_TABLET | ORAL | Status: DC | PRN
  Administered 2013-10-03: 2 via ORAL
  Filled 2013-10-02: qty 2

## 2013-10-02 MED ORDER — HYDROMORPHONE HCL PF 1 MG/ML IJ SOLN
INTRAMUSCULAR | Status: AC
  Filled 2013-10-02: qty 1

## 2013-10-02 MED ORDER — ROCURONIUM BROMIDE 100 MG/10ML IV SOLN
INTRAVENOUS | Status: DC | PRN
  Administered 2013-10-02: 30 mg via INTRAVENOUS
  Administered 2013-10-02: 10 mg via INTRAVENOUS

## 2013-10-02 MED ORDER — ONDANSETRON HCL 4 MG/2ML IJ SOLN
INTRAMUSCULAR | Status: AC
  Filled 2013-10-02: qty 2

## 2013-10-02 MED ORDER — PROPOFOL 10 MG/ML IV BOLUS
INTRAVENOUS | Status: DC | PRN
  Administered 2013-10-02: 330 mg via INTRAVENOUS

## 2013-10-02 MED ORDER — MORPHINE SULFATE 2 MG/ML IJ SOLN
1.0000 mg | INTRAMUSCULAR | Status: DC | PRN
  Administered 2013-10-02 (×3): 4 mg via INTRAVENOUS
  Filled 2013-10-02 (×3): qty 2

## 2013-10-02 MED ORDER — HYDROMORPHONE HCL PF 1 MG/ML IJ SOLN
0.2500 mg | INTRAMUSCULAR | Status: DC | PRN
  Administered 2013-10-02 (×2): 0.5 mg via INTRAVENOUS

## 2013-10-02 MED ORDER — SUCCINYLCHOLINE CHLORIDE 20 MG/ML IJ SOLN
INTRAMUSCULAR | Status: AC
  Filled 2013-10-02: qty 1

## 2013-10-02 MED ORDER — LIDOCAINE HCL (CARDIAC) 20 MG/ML IV SOLN
INTRAVENOUS | Status: AC
  Filled 2013-10-02: qty 15

## 2013-10-02 MED ORDER — 0.9 % SODIUM CHLORIDE (POUR BTL) OPTIME
TOPICAL | Status: DC | PRN
  Administered 2013-10-02: 1000 mL

## 2013-10-02 MED ORDER — BUPIVACAINE-EPINEPHRINE 0.25% -1:200000 IJ SOLN
INTRAMUSCULAR | Status: DC | PRN
  Administered 2013-10-02: 20 mL

## 2013-10-02 MED ORDER — ONDANSETRON HCL 4 MG/2ML IJ SOLN: 4.0000 mg | Freq: Once | INTRAMUSCULAR | Status: DC | PRN

## 2013-10-02 MED ORDER — GLYCOPYRROLATE 0.2 MG/ML IJ SOLN
INTRAMUSCULAR | Status: DC | PRN
  Administered 2013-10-02: 0.4 mg via INTRAVENOUS

## 2013-10-02 MED ORDER — NEOSTIGMINE METHYLSULFATE 10 MG/10ML IV SOLN
INTRAVENOUS | Status: DC | PRN
  Administered 2013-10-02: 3 mg via INTRAVENOUS

## 2013-10-02 MED ORDER — SODIUM CHLORIDE 0.9 % IR SOLN
Status: DC | PRN
  Administered 2013-10-02: 1000 mL

## 2013-10-02 MED ORDER — PROPOFOL 10 MG/ML IV BOLUS
INTRAVENOUS | Status: AC
  Filled 2013-10-02: qty 20

## 2013-10-02 MED ORDER — KETOROLAC TROMETHAMINE 30 MG/ML IJ SOLN
INTRAMUSCULAR | Status: DC | PRN
  Administered 2013-10-02: 30 mg via INTRAVENOUS

## 2013-10-02 MED ORDER — MIDAZOLAM HCL 2 MG/2ML IJ SOLN
INTRAMUSCULAR | Status: AC
  Filled 2013-10-02: qty 2

## 2013-10-02 MED ORDER — LIDOCAINE HCL 4 % MT SOLN
OROMUCOSAL | Status: DC | PRN
  Administered 2013-10-02: 4 mL via TOPICAL

## 2013-10-02 MED ORDER — NEOSTIGMINE METHYLSULFATE 10 MG/10ML IV SOLN
INTRAVENOUS | Status: AC
  Filled 2013-10-02: qty 1

## 2013-10-02 MED ORDER — MIDAZOLAM HCL 5 MG/5ML IJ SOLN
INTRAMUSCULAR | Status: DC | PRN
  Administered 2013-10-02: 2 mg via INTRAVENOUS

## 2013-10-02 MED ORDER — LACTATED RINGERS IV SOLN
INTRAVENOUS | Status: DC
Start: 2013-10-02 — End: 2013-10-03
  Administered 2013-10-02: 13:00:00 via INTRAVENOUS

## 2013-10-02 MED ORDER — FENTANYL CITRATE 0.05 MG/ML IJ SOLN
INTRAMUSCULAR | Status: AC
  Filled 2013-10-02: qty 5

## 2013-10-02 MED ORDER — GLYCOPYRROLATE 0.2 MG/ML IJ SOLN
INTRAMUSCULAR | Status: AC
  Filled 2013-10-02: qty 2

## 2013-10-02 MED ORDER — SUCCINYLCHOLINE CHLORIDE 20 MG/ML IJ SOLN
INTRAMUSCULAR | Status: DC | PRN
  Administered 2013-10-02: 120 mg via INTRAVENOUS

## 2013-10-02 MED ORDER — ONDANSETRON HCL 4 MG/2ML IJ SOLN
INTRAMUSCULAR | Status: DC | PRN
  Administered 2013-10-02: 4 mg via INTRAVENOUS

## 2013-10-02 MED ORDER — ROCURONIUM BROMIDE 50 MG/5ML IV SOLN
INTRAVENOUS | Status: AC
  Filled 2013-10-02: qty 3

## 2013-10-02 MED ORDER — KETOROLAC TROMETHAMINE 30 MG/ML IJ SOLN
INTRAMUSCULAR | Status: AC
  Filled 2013-10-02: qty 1

## 2013-10-02 MED ORDER — LIDOCAINE HCL (CARDIAC) 20 MG/ML IV SOLN
INTRAVENOUS | Status: DC | PRN
  Administered 2013-10-02: 100 mg via INTRAVENOUS

## 2013-10-02 MED ORDER — BUPIVACAINE-EPINEPHRINE (PF) 0.25% -1:200000 IJ SOLN
INTRAMUSCULAR | Status: AC
  Filled 2013-10-02: qty 30

## 2013-10-02 SURGICAL SUPPLY — 50 items
APPLIER CLIP 5 13 M/L LIGAMAX5 (MISCELLANEOUS)
APPLIER CLIP ROT 10 11.4 M/L (STAPLE)
BANDAGE ADH SHEER 1  50/CT (GAUZE/BANDAGES/DRESSINGS) ×9 IMPLANT
BENZOIN TINCTURE PRP APPL 2/3 (GAUZE/BANDAGES/DRESSINGS) ×3 IMPLANT
CANISTER SUCTION 2500CC (MISCELLANEOUS) ×3 IMPLANT
CHLORAPREP W/TINT 26ML (MISCELLANEOUS) ×3 IMPLANT
CLIP APPLIE 5 13 M/L LIGAMAX5 (MISCELLANEOUS) IMPLANT
CLIP APPLIE ROT 10 11.4 M/L (STAPLE) IMPLANT
CONT SPEC 4OZ CLIKSEAL STRL BL (MISCELLANEOUS) ×3 IMPLANT
COVER SURGICAL LIGHT HANDLE (MISCELLANEOUS) ×3 IMPLANT
CUTTER LINEAR ENDO 35 ETS (STAPLE) ×3 IMPLANT
CUTTER LINEAR ENDO 35 ETS TH (STAPLE) IMPLANT
DECANTER SPIKE VIAL GLASS SM (MISCELLANEOUS) IMPLANT
DRAPE C-ARM 42X72 X-RAY (DRAPES) IMPLANT
DRAPE UTILITY 15X26 W/TAPE STR (DRAPE) ×6 IMPLANT
ELECT REM PT RETURN 9FT ADLT (ELECTROSURGICAL) ×3
ELECTRODE REM PT RTRN 9FT ADLT (ELECTROSURGICAL) ×1 IMPLANT
ENDOLOOP SUT PDS II  0 18 (SUTURE)
ENDOLOOP SUT PDS II 0 18 (SUTURE) IMPLANT
GLOVE BIO SURGEON STRL SZ7.5 (GLOVE) ×6 IMPLANT
GLOVE BIOGEL PI IND STRL 7.0 (GLOVE) ×2 IMPLANT
GLOVE BIOGEL PI IND STRL 7.5 (GLOVE) ×3 IMPLANT
GLOVE BIOGEL PI INDICATOR 7.0 (GLOVE) ×4
GLOVE BIOGEL PI INDICATOR 7.5 (GLOVE) ×6
GLOVE SURG SIGNA 7.5 PF LTX (GLOVE) ×3 IMPLANT
GLOVE SURG SS PI 7.0 STRL IVOR (GLOVE) ×6 IMPLANT
GOWN STRL REUS W/ TWL LRG LVL3 (GOWN DISPOSABLE) ×3 IMPLANT
GOWN STRL REUS W/ TWL XL LVL3 (GOWN DISPOSABLE) ×1 IMPLANT
GOWN STRL REUS W/TWL LRG LVL3 (GOWN DISPOSABLE) ×6
GOWN STRL REUS W/TWL XL LVL3 (GOWN DISPOSABLE) ×2
KIT BASIN OR (CUSTOM PROCEDURE TRAY) ×3 IMPLANT
KIT ROOM TURNOVER OR (KITS) ×3 IMPLANT
NS IRRIG 1000ML POUR BTL (IV SOLUTION) ×3 IMPLANT
PAD ARMBOARD 7.5X6 YLW CONV (MISCELLANEOUS) ×6 IMPLANT
POUCH SPECIMEN RETRIEVAL 10MM (ENDOMECHANICALS) ×3 IMPLANT
RELOAD /EVU35 (ENDOMECHANICALS) IMPLANT
RELOAD CUTTER ETS 35MM STAND (ENDOMECHANICALS) IMPLANT
SCALPEL HARMONIC ACE (MISCELLANEOUS) ×3 IMPLANT
SCISSORS LAP 5X35 DISP (ENDOMECHANICALS) IMPLANT
SET IRRIG TUBING LAPAROSCOPIC (IRRIGATION / IRRIGATOR) ×3 IMPLANT
SLEEVE ENDOPATH XCEL 5M (ENDOMECHANICALS) ×3 IMPLANT
SPECIMEN JAR SMALL (MISCELLANEOUS) IMPLANT
SUT MON AB 4-0 PC3 18 (SUTURE) ×3 IMPLANT
TOWEL OR 17X24 6PK STRL BLUE (TOWEL DISPOSABLE) IMPLANT
TOWEL OR 17X26 10 PK STRL BLUE (TOWEL DISPOSABLE) ×3 IMPLANT
TRAY LAPAROSCOPIC (CUSTOM PROCEDURE TRAY) ×3 IMPLANT
TROCAR XCEL BLUNT TIP 100MML (ENDOMECHANICALS) ×3 IMPLANT
TROCAR XCEL NON-BLD 11X100MML (ENDOMECHANICALS) IMPLANT
TROCAR XCEL NON-BLD 5MMX100MML (ENDOMECHANICALS) ×3 IMPLANT
WATER STERILE IRR 1000ML POUR (IV SOLUTION) IMPLANT

## 2013-10-02 NOTE — Transfer of Care (Signed)
Immediate Anesthesia Transfer of Care Note  Patient: Jesse Underwood  Procedure(s) Performed: Procedure(s): DIAGNOSTIC LAPAROSCOPY (N/A) LAPAROSCOPIC APPENDECTOMY (N/A)  Patient Location: PACU  Anesthesia Type:General  Level of Consciousness: awake, alert , oriented and patient cooperative  Airway & Oxygen Therapy: Patient Spontanous Breathing  Post-op Assessment: Report given to PACU RN and Post -op Vital signs reviewed and stable  Post vital signs: Reviewed and stable  Complications: No apparent anesthesia complications

## 2013-10-02 NOTE — Progress Notes (Signed)
Utilization review completed.  

## 2013-10-02 NOTE — Anesthesia Postprocedure Evaluation (Signed)
  Anesthesia Post-op Note  Patient: Jesse Underwood  Procedure(s) Performed: Procedure(s): DIAGNOSTIC LAPAROSCOPY (N/A) LAPAROSCOPIC APPENDECTOMY (N/A)  Patient Location: PACU  Anesthesia Type:General  Level of Consciousness: awake, alert , oriented and patient cooperative  Airway and Oxygen Therapy: Patient Spontanous Breathing  Post-op Pain: mild  Post-op Assessment: Post-op Vital signs reviewed, Patient's Cardiovascular Status Stable, Respiratory Function Stable, Patent Airway, No signs of Nausea or vomiting and Pain level controlled  Post-op Vital Signs: stable  Last Vitals:  Filed Vitals:   10/02/13 1533  BP: 134/77  Pulse:   Temp: 37 C  Resp: 20    Complications: No apparent anesthesia complications

## 2013-10-02 NOTE — Anesthesia Preprocedure Evaluation (Addendum)
Anesthesia Evaluation  Patient identified by MRN, date of birth, ID band Patient awake    Reviewed: Allergy & Precautions, H&P , NPO status , Patient's Chart, lab work & pertinent test results  Airway Mallampati: II TM Distance: >3 FB Neck ROM: Full    Dental  (+) Dental Advisory Given, Teeth Intact   Pulmonary          Cardiovascular negative cardio ROS      Neuro/Psych negative neurological ROS     GI/Hepatic negative GI ROS, Neg liver ROS,   Endo/Other  Morbid obesity  Renal/GU negative Renal ROS     Musculoskeletal negative musculoskeletal ROS (+)   Abdominal   Peds  Hematology   Anesthesia Other Findings   Reproductive/Obstetrics                         Anesthesia Physical Anesthesia Plan  ASA: I  Anesthesia Plan: General   Post-op Pain Management:    Induction: Intravenous  Airway Management Planned: Oral ETT  Additional Equipment:   Intra-op Plan:   Post-operative Plan: Extubation in OR  Informed Consent: I have reviewed the patients History and Physical, chart, labs and discussed the procedure including the risks, benefits and alternatives for the proposed anesthesia with the patient or authorized representative who has indicated his/her understanding and acceptance.     Plan Discussed with:   Anesthesia Plan Comments:         Anesthesia Quick Evaluation

## 2013-10-02 NOTE — Op Note (Signed)
DIAGNOSTIC LAPAROSCOPY, LAPAROSCOPIC APPENDECTOMY  Procedure Note  BOOMER WINDERS 10/01/2013 - 10/02/2013   Pre-op Diagnosis: RIGHT LOWER QUADRANT PAIN     Post-op Diagnosis: acute appendicitis  Procedure(s): DIAGNOSTIC LAPAROSCOPY LAPAROSCOPIC APPENDECTOMY  Surgeon(s): Harl Bowie, MD  Anesthesia: General  Staff:  Circulator: Jaci Standard, RN Relief Scrub: Leslie Andrea, CST Scrub Person: Epifanio Lesches Pingue, CST; Lajean Manes, RN Circulator Assistant: Colin Ina, RN RN First Assistant: Quincy Carnes, RN  Estimated Blood Loss: Minimal               Specimens: sent to path          [facility]   Date: 10/02/2013  Time: 2:14 PM

## 2013-10-02 NOTE — Progress Notes (Signed)
Patient OOB and to bathroom to void.  Urine color, amber

## 2013-10-02 NOTE — Progress Notes (Signed)
Subjective: Still complains of pain RLQ, but also some generalized pain across abddmen.  Some nausea and vomiting reported last PM, not in the I/O. He says he does not feel better.    Objective: Vital signs in last 24 hours: Temp:  [98 F (36.7 C)-98.7 F (37.1 C)] 98.2 F (36.8 C) (07/16 0459) Pulse Rate:  [75-97] 97 (07/16 0459) Resp:  [16-18] 16 (07/16 0459) BP: (114-156)/(71-94) 153/89 mmHg (07/16 0459) SpO2:  [95 %-100 %] 96 % (07/16 0459) Weight:  [127.007 kg (280 lb)-131.09 kg (289 lb)] 127.007 kg (280 lb) (07/15 1814) Last BM Date: 10/01/13 NPO Afebrile VSS, BP up some. BMP ok Wbc is still up some Intake/Output from previous day: 07/15 0701 - 07/16 0700 In: 360 [P.O.:360] Out: 600 [Urine:600] Intake/Output this shift:    General appearance: alert, cooperative and no distress Resp: clear to auscultation bilaterally GI: soft, not distended.  He continues to complain of pain in the RLQ, and generalized pain across mid abdomen.  he also complains of some reboud tenderness, but its not clear rebound.  Lab Results:   Recent Labs  10/01/13 1514 10/02/13 0518  WBC 15.1* 13.4*  HGB 16.1 14.9  HCT 44.8 42.9  PLT 347 321    BMET  Recent Labs  10/01/13 1514 10/02/13 0518  NA 139 141  K 4.8 3.8  CL 98 101  CO2 25 22  GLUCOSE 91 95  BUN 6 7  CREATININE 0.60 0.65  CALCIUM 9.8 8.7   PT/INR No results found for this basename: LABPROT, INR,  in the last 72 hours   Recent Labs Lab 10/01/13 1514  AST 20  ALT 45  ALKPHOS 76  BILITOT 0.6  PROT 8.0  ALBUMIN 3.8     Lipase     Component Value Date/Time   LIPASE 19 10/01/2013 1514     Studies/Results: Ct Abdomen Pelvis W Contrast  10/01/2013   CLINICAL DATA:  Right lower quadrant pain with lower abdominal distension intermittent diarrhea and constipation next filled.  EXAM: CT ABDOMEN AND PELVIS WITH CONTRAST  TECHNIQUE: Multidetector CT imaging of the abdomen and pelvis was performed using the  standard protocol following bolus administration of intravenous contrast.  CONTRAST:  135mL OMNIPAQUE IOHEXOL 300 MG/ML SOLN intravenously ; the patient also received oral contrast material.  COMPARISON:  None.  FINDINGS: There are inflammatory changes of the right lower quadrant of the abdomen. This is closely applied to the appendix which is not markedly edematous. There is no appendicolith. The posterior aspect of the distal portion of the appendix is closely applied to the inflammatory changes. There is no discrete abscess nor evidence of free air. Inflammatory changes extend proximally along the mesenteric root and there are multiple enlarged mesenteric lymph nodes and retroperitoneal lymph nodes. There are prominent mesenteric vessels.  The transverse portion of duodenum exhibits minimal wall thickening andis at the superior extent of the inflammatory changes. The remaining bowel loops exhibit no evidence of inflammation, obstruction, or ileus. The terminal ileum is unremarkable. Contrast has just reached the cecum. The stool and gas pattern within the colon is unremarkable.  The liver, gallbladder, pancreas, spleen, stomach, adrenal glands, and kidneys exhibit no acute abnormalities. There is minimal prominence of the right renal collecting system and proximal ureter. The mid ureter becomes obscured by inflammatory changes in the adjacent soft tissues. There are punctate right adrenal calcifications. The urinary bladder and prostate gland are normal. There is no free pelvic fluid. There is no inguinal nor umbilical  hernia.  The lumbar spine and bony pelvis are unremarkable. The lung bases are clear.  IMPRESSION: 1. There are inflammatory changes in the right lower quadrant of the abdomen. There is no abscess or free fluid but there are is lymphadenopathy in the right lower quadrant and retroperitoneum. There is also prominence of the mesenteric vasculature in this region. This is likely secondary to atypical  acute appendicitis, but Crohn's disease, lymphoma, and granulomatous infectious are in the differential. 2. There is no small or large bowel obstruction or ileus. There is no acute colitis. 3. There is no acute hepatobiliary nor acute urinary tract abnormality. Minimal prominence of the right intrarenal collecting system and proximal ureter likely reflects involvement of the mid right ureter by the retroperitoneal inflammatory process. 4. These results were called by telephone at the time of interpretation on 10/01/2013 at 2:43 pm to Dr. Alysia Penna , who verbally acknowledged these results.   Electronically Signed   By: David  Martinique   On: 10/01/2013 14:49    Medications: . heparin  5,000 Units Subcutaneous 3 times per day  . piperacillin-tazobactam (ZOSYN)  IV  3.375 g Intravenous Q8H    Assessment/Plan 1. Right lower quadrant inflammatory process, unknown etiology  2.  Body mass index is 43.84 kg/(m^2).   Plan:  He says he is no better.  WBC better but still elevated, he continues to be tender.  Keep him NPO and discuss with Dr. Ninfa Linden.      LOS: 1 day    Jesse Underwood 10/02/2013

## 2013-10-02 NOTE — Progress Notes (Signed)
I have seen and examined the patient and agree with the assessment and plans. Source of pain and WBC not quiet certain.  After an discussion with the patient, we will proceed to the OR for a diagnostic laparoscopy and appendectomy.  I doubt I will be able to biopsy any of the adenopathy but this still might help with the diagnosis and this still may represent atypical appendicitis.  I discussed the risks including bleeding, infection, injury to surrounding structures, need for conversion to an open procedure, etc.  Sierrah Luevano A. Ninfa Linden  MD, FACS

## 2013-10-03 ENCOUNTER — Encounter (HOSPITAL_COMMUNITY): Payer: Self-pay | Admitting: Surgery

## 2013-10-03 DIAGNOSIS — K3580 Unspecified acute appendicitis: Secondary | ICD-10-CM

## 2013-10-03 HISTORY — DX: Unspecified acute appendicitis: K35.80

## 2013-10-03 LAB — CBC
HEMATOCRIT: 40.9 % (ref 39.0–52.0)
Hemoglobin: 13.5 g/dL (ref 13.0–17.0)
MCH: 30.8 pg (ref 26.0–34.0)
MCHC: 33 g/dL (ref 30.0–36.0)
MCV: 93.2 fL (ref 78.0–100.0)
PLATELETS: 304 10*3/uL (ref 150–400)
RBC: 4.39 MIL/uL (ref 4.22–5.81)
RDW: 12.8 % (ref 11.5–15.5)
WBC: 10.3 10*3/uL (ref 4.0–10.5)

## 2013-10-03 MED ORDER — IBUPROFEN 200 MG PO TABS
ORAL_TABLET | ORAL | Status: DC
Start: 2013-10-03 — End: 2013-10-17

## 2013-10-03 MED ORDER — AMOXICILLIN-POT CLAVULANATE 875-125 MG PO TABS: 1.0000 | ORAL_TABLET | Freq: Two times a day (BID) | ORAL | Status: DC

## 2013-10-03 MED ORDER — ACETAMINOPHEN 325 MG PO TABS: 650.0000 mg | ORAL_TABLET | Freq: Four times a day (QID) | ORAL | Status: AC | PRN

## 2013-10-03 MED ORDER — OXYCODONE-ACETAMINOPHEN 5-325 MG PO TABS: 1.0000 | ORAL_TABLET | ORAL | Status: DC | PRN

## 2013-10-03 NOTE — Discharge Instructions (Signed)
Laparoscopic Appendectomy °Care After °Refer to this sheet in the next few weeks. These instructions provide you with information on caring for yourself after your procedure. Your caregiver may also give you more specific instructions. Your treatment has been planned according to current medical practices, but problems sometimes occur. Call your caregiver if you have any problems or questions after your procedure. °HOME CARE INSTRUCTIONS °· Do not drive while taking narcotic pain medicines. °· Use stool softener if you become constipated from your pain medicines. °· Change your bandages (dressings) as directed. °· Keep your wounds clean and dry. You may wash the wounds gently with soap and water. Gently pat the wounds dry with a clean towel. °· Do not take baths, swim, or use hot tubs for 10 days, or as instructed by your caregiver. °· Only take over-the-counter or prescription medicines for pain, discomfort, or fever as directed by your caregiver. °· You may continue your normal diet as directed. °· Do not lift more than 10 pounds (4.5 kg) or play contact sports for 3 weeks, or as directed. °· Slowly increase your activity after surgery. °· Take deep breaths to avoid getting a lung infection (pneumonia). °SEEK MEDICAL CARE IF: °· You have redness, swelling, or increasing pain in your wounds. °· You have pus coming from your wounds. °· You have drainage from a wound that lasts longer than 1 day. °· You notice a bad smell coming from the wounds or dressing. °· Your wound edges break open after stitches (sutures) have been removed. °· You notice increasing pain in the shoulders (shoulder strap areas) or near your shoulder blades. °· You develop dizzy episodes or fainting while standing. °· You develop shortness of breath. °· You develop persistent nausea or vomiting. °· You cannot control your bowel functions or lose your appetite. °· You develop diarrhea. °SEEK IMMEDIATE MEDICAL CARE IF:  °· You have a fever. °· You  develop a rash. °· You have difficulty breathing or sharp pains in your chest. °· You develop any reaction or side effects to medicines given. °MAKE SURE YOU: °· Understand these instructions. °· Will watch your condition. °· Will get help right away if you are not doing well or get worse. °Document Released: 03/06/2005 Document Revised: 05/29/2011 Document Reviewed: 09/13/2010 °ExitCare® Patient Information ©2015 [employer]. This information is not intended to replace advice given to you by your health care provider. Make sure you discuss any questions you have with your health care provider. °[facility] ______[facility] °LAPAROSCOPIC SURGERY: POST OP INSTRUCTIONS °Always review your discharge instruction sheet given to you by the facility where your surgery was performed. °IF YOU HAVE DISABILITY OR FAMILY LEAVE FORMS, YOU MUST BRING THEM TO THE OFFICE FOR PROCESSING.   °DO NOT GIVE THEM TO YOUR DOCTOR. ° °1. A prescription for pain medication may be given to you upon discharge.  Take your pain medication as prescribed, if needed.  If narcotic pain medicine is not needed, then you may take acetaminophen (Tylenol) or ibuprofen (Advil) as needed. °2. Take your usually prescribed medications unless otherwise directed. °3. If you need a refill on your pain medication, please contact your pharmacy.  They will contact our office to request authorization. Prescriptions will not be filled after 5pm or on week-ends. °4. You should follow a light diet the first few days after arrival home, such as soup and crackers, etc.  Be sure to include lots of fluids daily. °5. Most patients will experience some swelling and bruising   in the area of the incisions.  Ice packs will help.  Swelling and bruising can take several days to resolve.  °6. It is common to experience some constipation if taking pain medication after surgery.  Increasing fluid intake and taking a stool softener (such as Colace) will usually help or  prevent this problem from occurring.  A mild laxative (Milk of Magnesia or Miralax) should be taken according to package instructions if there are no bowel movements after 48 hours. °7. Unless discharge instructions indicate otherwise, you may remove your bandages 24-48 hours after surgery, and you may shower at that time.  You may have steri-strips (small skin tapes) in place directly over the incision.  These strips should be left on the skin for 7-10 days.  If your surgeon used skin glue on the incision, you may shower in 24 hours.  The glue will flake off over the next 2-3 weeks.  Any sutures or staples will be removed at the office during your follow-up visit. °8. ACTIVITIES:  You may resume regular (light) daily activities beginning the next day--such as daily self-care, walking, climbing stairs--gradually increasing activities as tolerated.  You may have sexual intercourse when it is comfortable.  Refrain from any heavy lifting or straining until approved by your doctor. °a. You may drive when you are no longer taking prescription pain medication, you can comfortably wear a seatbelt, and you can safely maneuver your car and apply brakes. °b. RETURN TO WORK:  __________________________________________________________ °9. You should see your doctor in the office for a follow-up appointment approximately 2-3 weeks after your surgery.  Make sure that you call for this appointment within a day or two after you arrive home to insure a convenient appointment time. °10. OTHER INSTRUCTIONS: __________________________________________________________________________________________________________________________ __________________________________________________________________________________________________________________________ °WHEN TO CALL YOUR DOCTOR: °1. Fever over 101.0 °2. Inability to urinate °3. Continued bleeding from incision. °4. Increased pain, redness, or drainage from the incision. °5. Increasing  abdominal pain ° °The clinic staff is available to answer your questions during regular business hours.  Please don’t hesitate to call and ask to speak to one of the nurses for clinical concerns.  If you have a medical emergency, go to the nearest emergency room or call 911.  A surgeon from [facility] is always on call at the hospital. °[address], [address], [place], [place]  [address] ? [address] [address], [place], [place]   [address] °[phone] ? [phone] ? FAX [phone] °Web site: [email or web address] ° °

## 2013-10-03 NOTE — Op Note (Signed)
NAMELAROY, MUSTARD NO.:  [id number]  MEDICAL RECORD NO.:  [id number]  LOCATION:  [id number]                        FACILITY:  [facility]  PHYSICIAN:  [name], M.D. DATE OF BIRTH:  12-Jan-1991  DATE OF PROCEDURE:  10/02/2013 DATE OF DISCHARGE:                              OPERATIVE REPORT   PREOPERATIVE DIAGNOSIS:  Right lower quadrant abdominal pain.  POSTOPERATIVE DIAGNOSIS:  Acute appendicitis.  PROCEDURE: 1. Diagnostic laparoscopy. 2. Laparoscopic appendectomy.  SURGEONS:  [name], M.D.  ANESTHESIA:  General and 0.5% Marcaine.  ESTIMATED BLOOD LOSS:  Minimal.  INDICATIONS:  This is a 23 year old gentleman who presents with over a week of right-sided abdominal pain.  He had a CAT scan of the abdomen and pelvis which showed a moderate amount of inflammation in his retroperitoneum on the right side with adenopathy.  The appendix appeared normal except it was going into this process, so appendicitis cannot be ruled out.  He was placed on IV antibiotics.  He did not improve over a 12-hour period of time, so decision was made to proceed to the operating room.  FINDINGS:  The patient was found to have some slight turbid fluid in the abdomen.  The appendix appeared inflamed in the distal third.  No other intraabdominal pathology was identified.  PROCEDURE IN DETAIL:  The patient was brought to the operating room identified as Jesse Underwood.  He was placed supine on the operating room table.  General anesthesia was induced.  His abdomen was then prepped and draped in usual sterile fashion.  Using a #15 blade, a small vertical incision was made below the umbilicus.  This was carried down to fascia which was then opened with scalpel.  Hemostat was used to pass through the peritoneal cavity under direct vision.  A 0 Vicryl pursestring suture was then placed around the fascial opening.  The Hasson port was placed through the opening and insufflation  of the abdomen was begun.  A 5-mm port was then placed on the patient's right upper quadrant and another in the left lower quadrant under direct vision.  The patient had some straw-colored fluid in the pelvis.  I was able to identify the cecum and then elevate the appendix.  The appendix was not fixed into the retroperitoneum.  The distal third of the appendix, however, did appear inflamed.  I saw no other intraabdominal pathology.  At this point, I took down the mesoappendix with the Harmonic Scalpel.  I then transected the base of the appendix with the endoscopic GIA stapler.  The appendix was then placed in an Endosac and removed through the incision at the umbilicus.  At this point, I again evaluated the abdomen and saw no abnormalities.  There were no obvious lymph nodes that I could biopsy laparoscopically.  At this point, the abdomen was copiously irrigated with normal saline.  Hemostasis appeared to be achieved.  All ports were removed under direct vision, and the abdomen was deflated.  The 0 Vicryl at the umbilicus was tied in place closing the fascial defect.  All incisions were then anesthetized with Marcaine and closed with 4-0 Monocryl subcuticular sutures. Steri-Strips and Band-Aids were then applied.  The  patient tolerated the procedure well.  All the counts were correct at the end of procedure. The patient was then extubated in the operating room and taken in stable condition to recovery room.     [name], M.D.     DB/MEDQ  D:  10/02/2013  T:  10/03/2013  Job:  [id number]

## 2013-10-03 NOTE — Progress Notes (Signed)
1 Day Post-Op  Subjective: He feels fine and ready to go home.  I told him we plan 8 more days of antibiotics and he may even get another CT after he returns to see Dr. Ninfa Linden in the office.  Objective: Vital signs in last 24 hours: Temp:  [98.3 F (36.8 C)-98.8 F (37.1 C)] 98.4 F (36.9 C) (07/17 0553) Pulse Rate:  [72-101] 74 (07/17 0553) Resp:  [11-27] 16 (07/17 0553) BP: (125-154)/(59-91) 139/66 mmHg (07/17 0553) SpO2:  [91 %-100 %] 95 % (07/17 0553) Last BM Date: 10/01/13 1220 Po Afebrile, VSS WBC is coming down to normal Intake/Output from previous day: 07/16 0701 - 07/17 0700 In: 5633 [P.O.:1220; I.V.:3513; Blood:650; IV Piggyback:250] Out: 1000 [Urine:950; Blood:50] Intake/Output this shift:    General appearance: alert, cooperative and no distress GI: soft sore, + BS, port sites look fine  Lab Results:   Recent Labs  10/02/13 0518 10/03/13 0038  WBC 13.4* 10.3  HGB 14.9 13.5  HCT 42.9 40.9  PLT 321 304    BMET  Recent Labs  10/01/13 1514 10/02/13 0518  NA 139 141  K 4.8 3.8  CL 98 101  CO2 25 22  GLUCOSE 91 95  BUN 6 7  CREATININE 0.60 0.65  CALCIUM 9.8 8.7   PT/INR No results found for this basename: LABPROT, INR,  in the last 72 hours   Recent Labs Lab 10/01/13 1514  AST 20  ALT 45  ALKPHOS 76  BILITOT 0.6  PROT 8.0  ALBUMIN 3.8     Lipase     Component Value Date/Time   LIPASE 19 10/01/2013 1514     Studies/Results: Ct Abdomen Pelvis W Contrast  10/01/2013   CLINICAL DATA:  Right lower quadrant pain with lower abdominal distension intermittent diarrhea and constipation next filled.  EXAM: CT ABDOMEN AND PELVIS WITH CONTRAST  TECHNIQUE: Multidetector CT imaging of the abdomen and pelvis was performed using the standard protocol following bolus administration of intravenous contrast.  CONTRAST:  171mL OMNIPAQUE IOHEXOL 300 MG/ML SOLN intravenously ; the patient also received oral contrast material.  COMPARISON:  None.   FINDINGS: There are inflammatory changes of the right lower quadrant of the abdomen. This is closely applied to the appendix which is not markedly edematous. There is no appendicolith. The posterior aspect of the distal portion of the appendix is closely applied to the inflammatory changes. There is no discrete abscess nor evidence of free air. Inflammatory changes extend proximally along the mesenteric root and there are multiple enlarged mesenteric lymph nodes and retroperitoneal lymph nodes. There are prominent mesenteric vessels.  The transverse portion of duodenum exhibits minimal wall thickening andis at the superior extent of the inflammatory changes. The remaining bowel loops exhibit no evidence of inflammation, obstruction, or ileus. The terminal ileum is unremarkable. Contrast has just reached the cecum. The stool and gas pattern within the colon is unremarkable.  The liver, gallbladder, pancreas, spleen, stomach, adrenal glands, and kidneys exhibit no acute abnormalities. There is minimal prominence of the right renal collecting system and proximal ureter. The mid ureter becomes obscured by inflammatory changes in the adjacent soft tissues. There are punctate right adrenal calcifications. The urinary bladder and prostate gland are normal. There is no free pelvic fluid. There is no inguinal nor umbilical hernia.  The lumbar spine and bony pelvis are unremarkable. The lung bases are clear.  IMPRESSION: 1. There are inflammatory changes in the right lower quadrant of the abdomen. There is no abscess or  free fluid but there are is lymphadenopathy in the right lower quadrant and retroperitoneum. There is also prominence of the mesenteric vasculature in this region. This is likely secondary to atypical acute appendicitis, but Crohn's disease, lymphoma, and granulomatous infectious are in the differential. 2. There is no small or large bowel obstruction or ileus. There is no acute colitis. 3. There is no acute  hepatobiliary nor acute urinary tract abnormality. Minimal prominence of the right intrarenal collecting system and proximal ureter likely reflects involvement of the mid right ureter by the retroperitoneal inflammatory process. 4. These results were called by telephone at the time of interpretation on 10/01/2013 at 2:43 pm to Dr. Alysia Penna , who verbally acknowledged these results.   Electronically Signed   By: David  Martinique   On: 10/01/2013 14:49    Medications: . heparin  5,000 Units Subcutaneous 3 times per day  . piperacillin-tazobactam (ZOSYN)  IV  3.375 g Intravenous Q8H    Assessment/Plan Acute appendicitis. s/p Diagnostic laparoscopy. Laparoscopic appendectomy. Jesse Underwood, M.D.10/03/2013  Right lower quadrant inflammatory process Body mass index is 43.84 kg/(m^2).   Plan:  Advance diet, continue antibiotics for a total of 10 days and follow up with Dr. Ninfa Linden in 2 weeks      LOS: 2 days    Jesse Underwood 10/03/2013

## 2013-10-03 NOTE — Progress Notes (Signed)
I have seen and examined the patient and agree with the assessment and plans. Will follow up in the office.  Taren Dymek A. Ninfa Linden  MD, FACS

## 2013-10-03 NOTE — Discharge Summary (Signed)
Physician Discharge Summary  Patient ID: Jesse Underwood MRN: [id number] DOB/AGE: 1990-08-31 23 y.o.  Admit date: 10/01/2013 Discharge date: 10/03/2013  Admission Diagnoses:   1. Right lower quadrant inflammatory process, unknown etiology  2. Body mass index is 43.84 kg/(m^2).   Discharge Diagnoses:  Acute appendicitis.  s/p Diagnostic laparoscopy. Laparoscopic appendectomy. Jesse Underwood, M.D.10/03/2013  Right lower quadrant inflammatory process  Body mass index is 43.84 kg/(m^2).  Active Problems:   Abdominal pain   Appendicitis, acute   PROCEDURES:  Diagnostic laparoscopy. Laparoscopic appendectomy. Jesse Underwood, M.D. 10/03/2013.   Hospital Course:   This is a 23 year old otherwise healthy white male who presented to [facility] emergency department today after a CT scan by his primary care physician. Over a week ago the patient developed some vague right lower part of abdominal pain. It was of gradual onset. It started in the right lower quadrant and did not radiate anywhere. He denies any nausea or vomiting. He admits to some intermittent diarrhea and constipation over the last couple of days. He initially saw his primary care physician last week. Labs were drawn which were normal. He was treated conservatively at that time. He returned to his primary care doctor's office today to do continued pain. He was sent for a CT scan of his abdomen and pelvis. This revealed inflammatory changes in the right lower quadrant with no abscess or free fluid seen. He did have lymphadenopathy in the right lower quadrant and retroperitoneum. He had a prominence of his mesenteric vasculature in this region. Differentials include atypical appendicitis, Crohn's disease, lymphoma, or granulomatous infection. There is even some mild prominence of his right intrarenal collecting system and proximal ureter secondary to the retroperitoneal inflammatory process. We have been asked to evaluate the patient  for admission. He was admitted and placed on antibiotics, CT was not typical for appendicitis.  We hoped he would improve over night on antibiotics.  The following Am he was still having pain and tenderness, WBC was still elevated.  He was taken to the OR at that point.  It appeared to be appendicitis, and her had an appendectomy.  He was much better the following AM.  We plan to keep him on antibiotics for a total of 10 days.  Follow up with Dr. Ninfa Underwood in 2 weeks.   Condition on d/c:  Improved   Disposition: Final discharge disposition not confirmed     Medication List         acetaminophen 325 MG tablet  Commonly known as:  TYLENOL  Take 2 tablets (650 mg total) by mouth every 6 (six) hours as needed for mild pain (or Temp > 100).     amoxicillin-clavulanate 875-125 MG per tablet  Commonly known as:  AUGMENTIN  Take 1 tablet by mouth 2 (two) times daily.     ibuprofen 200 MG tablet  Commonly known as:  MOTRIN IB  You can take 2-3 tablets every 6 hours as needed for pain.     oxyCODONE-acetaminophen 5-325 MG per tablet  Commonly known as:  PERCOCET/ROXICET  Take 1-2 tablets by mouth every 4 (four) hours as needed for moderate pain.     senna 8.6 MG Tabs tablet  Commonly known as:  SENOKOT  Take 1 tablet by mouth daily as needed for mild constipation.     simethicone 80 MG chewable tablet  Commonly known as:  MYLICON  Chew 80 mg by mouth every 6 (six) hours as needed (for gas relief).  Follow-up Information   Follow up with [facility] A, MD. Schedule an appointment as soon as possible for a visit in 2 weeks.   Specialty:  General Surgery   Contact information:   [address] [place] [address] [phone]       Signed: Earnstine Underwood 10/03/2013, 11:52 AM

## 2013-10-03 NOTE — Progress Notes (Signed)
Discussed discharge summary with patient. Patient received Rx. Reviewed all medications with patient. Patient did not have any questions. Patient ready for discharge.

## 2013-10-07 NOTE — ED Provider Notes (Signed)
See prior note   Janice Norrie, MD 10/07/13 [phone]

## 2013-10-17 ENCOUNTER — Inpatient Hospital Stay (HOSPITAL_COMMUNITY)
Admission: EM | Admit: 2013-10-17 | Discharge: 2013-10-22 | DRG: 393 | Disposition: A | Discharge status: Home / Self Care | Payer: BC Managed Care – PPO | Attending: General Surgery | Admitting: General Surgery

## 2013-10-17 ENCOUNTER — Encounter (HOSPITAL_COMMUNITY): Payer: Self-pay | Admitting: Emergency Medicine

## 2013-10-17 ENCOUNTER — Emergency Department (HOSPITAL_COMMUNITY): Payer: BC Managed Care – PPO

## 2013-10-17 DIAGNOSIS — IMO0002 Reserved for concepts with insufficient information to code with codable children: Secondary | ICD-10-CM

## 2013-10-17 DIAGNOSIS — I82220 Acute embolism and thrombosis of inferior vena cava: Secondary | ICD-10-CM | POA: Diagnosis present

## 2013-10-17 DIAGNOSIS — R1012 Left upper quadrant pain: Secondary | ICD-10-CM

## 2013-10-17 DIAGNOSIS — I749 Embolism and thrombosis of unspecified artery: Secondary | ICD-10-CM | POA: Diagnosis present

## 2013-10-17 DIAGNOSIS — N433 Hydrocele, unspecified: Secondary | ICD-10-CM | POA: Diagnosis present

## 2013-10-17 DIAGNOSIS — K55059 Acute (reversible) ischemia of intestine, part and extent unspecified: Principal | ICD-10-CM | POA: Diagnosis present

## 2013-10-17 DIAGNOSIS — D72829 Elevated white blood cell count, unspecified: Secondary | ICD-10-CM | POA: Diagnosis present

## 2013-10-17 DIAGNOSIS — Z79899 Other long term (current) drug therapy: Secondary | ICD-10-CM

## 2013-10-17 DIAGNOSIS — F1210 Cannabis abuse, uncomplicated: Secondary | ICD-10-CM | POA: Diagnosis present

## 2013-10-17 DIAGNOSIS — D473 Essential (hemorrhagic) thrombocythemia: Secondary | ICD-10-CM | POA: Diagnosis present

## 2013-10-17 DIAGNOSIS — R109 Unspecified abdominal pain: Secondary | ICD-10-CM

## 2013-10-17 DIAGNOSIS — K55069 Acute infarction of intestine, part and extent unspecified: Secondary | ICD-10-CM

## 2013-10-17 DIAGNOSIS — I871 Compression of vein: Secondary | ICD-10-CM | POA: Diagnosis present

## 2013-10-17 DIAGNOSIS — F1010 Alcohol abuse, uncomplicated: Secondary | ICD-10-CM | POA: Diagnosis present

## 2013-10-17 DIAGNOSIS — K5900 Constipation, unspecified: Secondary | ICD-10-CM | POA: Diagnosis present

## 2013-10-17 DIAGNOSIS — I82890 Acute embolism and thrombosis of other specified veins: Secondary | ICD-10-CM

## 2013-10-17 DIAGNOSIS — R112 Nausea with vomiting, unspecified: Secondary | ICD-10-CM

## 2013-10-17 DIAGNOSIS — E669 Obesity, unspecified: Secondary | ICD-10-CM | POA: Diagnosis present

## 2013-10-17 DIAGNOSIS — K759 Inflammatory liver disease, unspecified: Secondary | ICD-10-CM

## 2013-10-17 DIAGNOSIS — R590 Localized enlarged lymph nodes: Secondary | ICD-10-CM | POA: Diagnosis present

## 2013-10-17 DIAGNOSIS — D6859 Other primary thrombophilia: Secondary | ICD-10-CM | POA: Diagnosis present

## 2013-10-17 DIAGNOSIS — Z6841 Body Mass Index (BMI) 40.0 and over, adult: Secondary | ICD-10-CM

## 2013-10-17 DIAGNOSIS — F1910 Other psychoactive substance abuse, uncomplicated: Secondary | ICD-10-CM | POA: Diagnosis present

## 2013-10-17 HISTORY — DX: Fracture of unspecified part of right clavicle, initial encounter for closed fracture: S42.001A

## 2013-10-17 LAB — COMPREHENSIVE METABOLIC PANEL
ALT: 110 U/L — AB (ref 0–53)
ANION GAP: 18 — AB (ref 5–15)
AST: 33 U/L (ref 0–37)
Albumin: 3.9 g/dL (ref 3.5–5.2)
Alkaline Phosphatase: 92 U/L (ref 39–117)
BUN: 9 mg/dL (ref 6–23)
CO2: 23 mEq/L (ref 19–32)
Calcium: 9.5 mg/dL (ref 8.4–10.5)
Chloride: 98 mEq/L (ref 96–112)
Creatinine, Ser: 0.61 mg/dL (ref 0.50–1.35)
GFR calc Af Amer: >90 mL/min (ref >90)
GFR calc non Af Amer: >90 mL/min (ref >90)
Glucose, Bld: 83 mg/dL (ref 70–99)
POTASSIUM: 4.2 meq/L (ref 3.7–5.3)
SODIUM: 139 meq/L (ref 137–147)
TOTAL PROTEIN: 7.7 g/dL (ref 6.0–8.3)
Total Bilirubin: 0.4 mg/dL (ref 0.3–1.2)

## 2013-10-17 LAB — CBC WITH DIFFERENTIAL/PLATELET
BASOS ABS: 0.1 10*3/uL (ref 0.0–0.1)
Basophils Relative: 0 % (ref 0–1)
EOS ABS: 0.3 10*3/uL (ref 0.0–0.7)
Eosinophils Relative: 2 % (ref 0–5)
HCT: 44.6 % (ref 39.0–52.0)
Hemoglobin: 15.1 g/dL (ref 13.0–17.0)
Lymphocytes Relative: 26 % (ref 12–46)
Lymphs Abs: 3.1 10*3/uL (ref 0.7–4.0)
MCH: 30.8 pg (ref 26.0–34.0)
MCHC: 33.9 g/dL (ref 30.0–36.0)
MCV: 90.8 fL (ref 78.0–100.0)
Monocytes Absolute: 1.4 10*3/uL — ABNORMAL HIGH (ref 0.1–1.0)
Monocytes Relative: 12 % (ref 3–12)
NEUTROS PCT: 60 % (ref 43–77)
Neutro Abs: 7.3 10*3/uL (ref 1.7–7.7)
PLATELETS: 460 10*3/uL — AB (ref 150–400)
RBC: 4.91 MIL/uL (ref 4.22–5.81)
RDW: 12.6 % (ref 11.5–15.5)
WBC: 12.2 10*3/uL — ABNORMAL HIGH (ref 4.0–10.5)

## 2013-10-17 LAB — URINALYSIS, ROUTINE W REFLEX MICROSCOPIC
Bilirubin Urine: NEGATIVE
Glucose, UA: NEGATIVE mg/dL
HGB URINE DIPSTICK: NEGATIVE
Ketones, ur: 15 mg/dL — AB
Leukocytes, UA: NEGATIVE
Nitrite: NEGATIVE
Protein, ur: NEGATIVE mg/dL
SPECIFIC GRAVITY, URINE: 1.02 (ref 1.005–1.030)
UROBILINOGEN UA: 0.2 mg/dL (ref 0.0–1.0)
pH: 5.5 (ref 5.0–8.0)

## 2013-10-17 LAB — SAVE SMEAR

## 2013-10-17 LAB — HEPARIN LEVEL (UNFRACTIONATED)

## 2013-10-17 LAB — I-STAT CG4 LACTIC ACID, ED: LACTIC ACID, VENOUS: 0.34 mmol/L — AB (ref 0.5–2.2)

## 2013-10-17 LAB — ANTITHROMBIN III: AntiThromb III Func: 107 % (ref 75–120)

## 2013-10-17 LAB — LIPASE, BLOOD: Lipase: 34 U/L (ref 11–59)

## 2013-10-17 MED ORDER — HYDROMORPHONE HCL PF 1 MG/ML IJ SOLN
2.0000 mg | INTRAMUSCULAR | Status: DC | PRN
  Administered 2013-10-17 – 2013-10-18 (×2): 2 mg via INTRAVENOUS
  Administered 2013-10-18: 3 mg via INTRAVENOUS
  Administered 2013-10-18 (×8): 2 mg via INTRAVENOUS
  Administered 2013-10-19 – 2013-10-20 (×12): 3 mg via INTRAVENOUS
  Filled 2013-10-17: qty 2
  Filled 2013-10-17 (×2): qty 3
  Filled 2013-10-17: qty 2
  Filled 2013-10-17 (×2): qty 3
  Filled 2013-10-17 (×2): qty 2
  Filled 2013-10-17 (×2): qty 3
  Filled 2013-10-17: qty 2
  Filled 2013-10-17 (×3): qty 3
  Filled 2013-10-17 (×5): qty 2
  Filled 2013-10-17 (×4): qty 3

## 2013-10-17 MED ORDER — ONDANSETRON HCL 4 MG/2ML IJ SOLN
4.0000 mg | Freq: Four times a day (QID) | INTRAMUSCULAR | Status: DC | PRN
  Administered 2013-10-17 – 2013-10-21 (×11): 4 mg via INTRAVENOUS
  Filled 2013-10-17 (×13): qty 2

## 2013-10-17 MED ORDER — DIPHENHYDRAMINE HCL 12.5 MG/5ML PO ELIX: 12.5000 mg | ORAL_SOLUTION | Freq: Four times a day (QID) | ORAL | Status: DC | PRN

## 2013-10-17 MED ORDER — HYDROMORPHONE HCL PF 1 MG/ML IJ SOLN
1.0000 mg | Freq: Once | INTRAMUSCULAR | Status: AC
  Administered 2013-10-17: 1 mg via INTRAVENOUS
  Filled 2013-10-17: qty 1

## 2013-10-17 MED ORDER — HEPARIN BOLUS VIA INFUSION
3000.0000 [IU] | Freq: Once | INTRAVENOUS | Status: AC
  Administered 2013-10-17: 3000 [IU] via INTRAVENOUS
  Filled 2013-10-17: qty 3000

## 2013-10-17 MED ORDER — SODIUM CHLORIDE 0.9 % IV SOLN
INTRAVENOUS | Status: DC
  Administered 2013-10-17: 100 mL/h via INTRAVENOUS
  Administered 2013-10-18 – 2013-10-22 (×11): via INTRAVENOUS

## 2013-10-17 MED ORDER — ONDANSETRON HCL 4 MG/2ML IJ SOLN
4.0000 mg | Freq: Once | INTRAMUSCULAR | Status: AC
  Administered 2013-10-17: 4 mg via INTRAVENOUS
  Filled 2013-10-17: qty 2

## 2013-10-17 MED ORDER — GADOBENATE DIMEGLUMINE 529 MG/ML IV SOLN
20.0000 mL | Freq: Once | INTRAVENOUS | Status: AC | PRN
  Administered 2013-10-17: 20 mL via INTRAVENOUS

## 2013-10-17 MED ORDER — DIPHENHYDRAMINE HCL 50 MG/ML IJ SOLN
12.5000 mg | Freq: Four times a day (QID) | INTRAMUSCULAR | Status: DC | PRN
  Administered 2013-10-19: 25 mg via INTRAVENOUS
  Administered 2013-10-20: 01:00:00 via INTRAVENOUS
  Administered 2013-10-21 (×2): 25 mg via INTRAVENOUS
  Filled 2013-10-17 (×4): qty 1

## 2013-10-17 MED ORDER — HEPARIN (PORCINE) IN NACL 100-0.45 UNIT/ML-% IJ SOLN
3300.0000 [IU]/h | INTRAMUSCULAR | Status: DC
  Administered 2013-10-17: 1400 [IU]/h via INTRAVENOUS
  Administered 2013-10-17: 1900 [IU]/h via INTRAVENOUS
  Administered 2013-10-18 – 2013-10-19 (×4): 2700 [IU]/h via INTRAVENOUS
  Administered 2013-10-20: 3000 [IU]/h via INTRAVENOUS
  Administered 2013-10-20 (×2): 3300 [IU]/h via INTRAVENOUS
  Filled 2013-10-17 (×13): qty 250

## 2013-10-17 MED ORDER — HYDROMORPHONE HCL PF 1 MG/ML IJ SOLN
1.0000 mg | INTRAMUSCULAR | Status: DC | PRN
  Administered 2013-10-17 (×7): 1 mg via INTRAVENOUS
  Filled 2013-10-17 (×7): qty 1

## 2013-10-17 MED ORDER — SODIUM CHLORIDE 0.9 % IV SOLN
1000.0000 mL | Freq: Once | INTRAVENOUS | Status: AC
  Administered 2013-10-17: 1000 mL via INTRAVENOUS

## 2013-10-17 MED ORDER — SODIUM CHLORIDE 0.9 % IV BOLUS (SEPSIS)
1000.0000 mL | Freq: Once | INTRAVENOUS | Status: AC
  Administered 2013-10-17: 1000 mL via INTRAVENOUS

## 2013-10-17 MED ORDER — DOCUSATE SODIUM 100 MG PO CAPS
100.0000 mg | ORAL_CAPSULE | Freq: Two times a day (BID) | ORAL | Status: DC
  Administered 2013-10-17 – 2013-10-22 (×10): 100 mg via ORAL
  Filled 2013-10-17 (×10): qty 1

## 2013-10-17 NOTE — ED Notes (Signed)
Dr. Docherty at bedside.

## 2013-10-17 NOTE — ED Notes (Addendum)
Dr.Docherty and Charge RN aware of delay in MRI

## 2013-10-17 NOTE — ED Notes (Signed)
Spoke to MRI tech, patient should be transported "shortly"

## 2013-10-17 NOTE — ED Notes (Signed)
Attempted to call report

## 2013-10-17 NOTE — ED Provider Notes (Signed)
Care transferred from Dr. Vanita Panda on 23yo w/ appendectomy 2 weeks ago presenting with 2-3 days of worsening abdominal pain, nausea, vom, constipation. 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 Enlargement of the superior mesenteric vein. Venous thrombosis should be excluded." Rec were for CTA or MRI.  MR abdomen w/wo contrast able to be done from ED after significant wait and showed...   "1. 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 There is extensive acute/subacute thrombosis of the superior mesenteric vein and collateral vessels draining into it. The cause of the IVC occlusion is inapparent, potentially congenital. 2. Diffuse mesenteric edema is present associated with this venous thrombosis. No focal extraluminal fluid collections identified. 3. In addition to the dilated vessels, there are few mildly prominent mesenteric lymph nodes which are probably reactive. 4. No evidence of bowel obstruction or bowel wall thickening."  I believe findings explains pain. LA not elevated. Clot burden may have been worsened by recent immobilization after surgery. Spoke w/ Dr. Oneida Alar of vascular surgery who rec heparin gtt and [facility] consult. [facility] consulted and will send PA to see pt in the ED for admission.   Findings discussed with pt & father.     1. Intractable abdominal pain   2. Superior mesenteric vein thrombosis        Neta Ehlers, MD 10/17/13 [phone]

## 2013-10-17 NOTE — ED Notes (Addendum)
Pt reports he began experiencing abdominal pain yesterday.  Pain steadily worsened so he came to ED.  Pt was in room groaning out loud until pain medication given.

## 2013-10-17 NOTE — ED Notes (Signed)
Pt's father contact info: Jenny Reichmann, [phone]

## 2013-10-17 NOTE — Progress Notes (Signed)
ANTICOAGULATION CONSULT NOTE - Initial Consult  Pharmacy Consult:  Heparin Indication:  Intra-abdominal clot  No Known Allergies  Patient Measurements: Height: 5\' 9"  (175.3 cm) Weight: 280 lb (127.007 kg) IBW/kg (Calculated) : 70.7 Heparin Dosing Weight: 100 kg  Vital Signs: Temp: 97.9 F (36.6 C) (07/31 1122) BP: 151/78 mmHg (07/31 1446) Pulse Rate: 77 (07/31 1446)  Labs:  Recent Labs  10/17/13 0159  HGB 15.1  HCT 44.6  PLT 460*  CREATININE 0.61    Estimated Creatinine Clearance: 190.9 ml/min (by C-G formula based on Cr of 0.61).   Medical History: Past Medical History  Diagnosis Date  . Appendicitis, acute 10/03/2013       Assessment: Jesse Underwood with history of appendectomy 2 weeks ago presented with abdominal pain, nausea and vomiting.  Pharmacy consulted to initiate IV heparin for rule out intra-abdominal clot.  Baseline labs and home meds reviewed.  No bleeding documented.   Goal of Therapy:  Heparin level 0.3-0.7 units/ml Monitor platelets by anticoagulation protocol: Yes    Plan:  - Heparin 3000 units IV bolus x 1, then - Heparin gtt at 1400 units/hr - Check 6 hr HL - Daily HL / CBC    Zerenity Bowron D. Mina Marble, PharmD, BCPS Pager:  [phone] 10/17/2013, 3:29 PM

## 2013-10-17 NOTE — Progress Notes (Signed)
ANTICOAGULATION CONSULT NOTE - Follow Up Consult  Pharmacy Consult for heparin Indication: intraabdominal clot  Labs:  Recent Labs  10/17/13 0159 10/17/13 2114  HGB 15.1  --   HCT 44.6  --   PLT 460*  --   HEPARINUNFRC  --  <0.10*  CREATININE 0.61  --     Assessment: 23yo male undetectable on heparin with initial dosing for post-op intraabdominal clot.  Goal of Therapy:  Heparin level 0.3-0.7 units/ml   Plan:  Will rebolus with heparin 3000 units and increase gtt by 4 units/kg/hr to 1900 units/hr and check level with am labs.  Wynona Neat, PharmD, BCPS  10/17/2013,10:33 PM

## 2013-10-17 NOTE — ED Notes (Signed)
Pt. reports mid abdominal pain with nausea , constipation and vomitting x1  Onset yesterday . Denies urinary discomfort , no fever or chills. Appendectomy 2 weeks ago .

## 2013-10-17 NOTE — ED Notes (Signed)
Paged Dr. Marin Olp to Dr. Hulen Skains

## 2013-10-17 NOTE — Consult Note (Signed)
[facility] Admission Note  Jesse Underwood Oct 15, 1990  [id number].    Requesting MD: Dr. Tawnya Crook Chief Complaint/Reason for Consult: Acute abdominal pain  HPI:  23 y/o white male with no significant PMH presents to [facility] with 3 days (10/15/13) of worsening abdominal pain, nausea, vommiting, constipation.  He recently had a laparoscopic appendectomy for appendicitis he was discharged with 7 days of PO antibiotics which he promptly finished.  He notes he has normal post-surgical pain which resolved and he was feeling great until 3 days ago.  Admits to trouble sleeping for the last week.  He denies fever, chills, diarrhea, fatigue, musculoskeletal pain or loss of ROM, weakness, dizziness, CP/SOB, or confusion.  No radicular symptoms or alleviating/precipitating factors.  The patient uses marijuana daily 1-2 blunts and has used psychedelics (LSD etc.) drugs in the past.  He just finished college this spring and is not currently working.  No developmental, cogitative, or physical delays per Dad.  Dad notes no blood disorders, problems with vessels/arteries, no aneurysms or strokes.  No family h/o requiring blood thinners.  No family h/o abdominal surgeries except mother who had a colon obstruction and resection. Only relevant family history is mother, maternal grandfather and maternal uncle had CAD/HTN and had heart bypasses.  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 Enlargement of the superior mesenteric vein. Venous thrombosis should be excluded." MRI shows:  "1. 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 There is extensive acute/subacute thrombosis of the superior mesenteric vein and collateral vessels draining into it. The cause of the IVC occlusion is inapparent, potentially congenital. 2.  Diffuse mesenteric edema is present associated with this venous thrombosis. No focal extraluminal fluid collections identified. 3. In addition to the dilated vessels, there are few mildly prominent mesenteric lymph nodes which are probably reactive. 4. No evidence of bowel obstruction or bowel wall thickening."  Dr. Oneida Alar from vascular surgery was consulted and recommended starting heparin and consulting general surgery.     ROS: All systems reviewed and otherwise negative except for as above  Family History  Problem Relation Age of Onset  . Coronary artery disease Mother   . Coronary artery disease      Uncle  . Coronary artery disease      Maternal grandfather  . Stroke      paternal grandmother - occular  . Colonic polyp Mother     colon obstruction    Past Medical History  Diagnosis Date  . Appendicitis, acute 10/03/2013  . Right clavicle fracture     10-93 years old    Past Surgical History  Procedure Laterality Date  . Laparoscopy N/A 10/02/2013    Procedure: DIAGNOSTIC LAPAROSCOPY;  Surgeon: Harl Bowie, MD;  Location: [place];  Service: General;  Laterality: N/A;  . Laparoscopic appendectomy N/A 10/02/2013    Procedure: LAPAROSCOPIC APPENDECTOMY;  Surgeon: Harl Bowie, MD;  Location: [place];  Service: General;  Laterality: N/A;    Social History:  reports that he has never smoked. He has never used smokeless tobacco. He reports that he drinks alcohol. He reports that he uses illicit drugs (Marijuana).  Allergies: No Known Allergies   (Not in a hospital admission)  Blood pressure 156/93, pulse 96, temperature 97.9 F (36.6 C), temperature source Oral, resp. rate 18, height 5' 9"  (1.753 m), weight 280 lb (127.007  kg), SpO2 97.00%. Physical Exam: General: Obese pleasant, WD/WN white male who is laying in bed in NAD HEENT: head is normocephalic, atraumatic.  Sclera are noninjected.  PERRL.  Ears and nose without any masses or lesions.  Mouth is pink and  moist Heart: regular, rate, and rhythm.  No obvious murmurs, gallops, or rubs noted.  Palpable pedal pulses bilaterally Lungs: CTAB, no wheezes, rhonchi, or rales noted.  Respiratory effort nonlabored Abd: soft, ND, mild tenderness supraumbilical and periumbilical, +BS, no masses, hernias, or organomegaly, incisions port sites are well healed, no rebound or guarding MS: all 4 extremities are symmetrical with no cyanosis, clubbing, or edema. Skin: warm and dry with no masses, lesions, or rashes Psych: A&Ox3 with an appropriate affect. Neuro: CM 2-12 intact, extremity CSM intact bilaterally, normal speech  Results for orders placed during the hospital encounter of 10/17/13 (from the past 48 hour(s))  CBC WITH DIFFERENTIAL     Status: Abnormal   Collection Time    10/17/13  1:59 AM      Result Value Ref Range   WBC 12.2 (*) 4.0 - 10.5 K/uL   RBC 4.91  4.22 - 5.81 MIL/uL   Hemoglobin 15.1  13.0 - 17.0 g/dL   HCT 44.6  39.0 - 52.0 %   MCV 90.8  78.0 - 100.0 fL   MCH 30.8  26.0 - 34.0 pg   MCHC 33.9  30.0 - 36.0 g/dL   RDW 12.6  11.5 - 15.5 %   Platelets 460 (*) 150 - 400 K/uL   Neutrophils Relative % 60  43 - 77 %   Neutro Abs 7.3  1.7 - 7.7 K/uL   Lymphocytes Relative 26  12 - 46 %   Lymphs Abs 3.1  0.7 - 4.0 K/uL   Monocytes Relative 12  3 - 12 %   Monocytes Absolute 1.4 (*) 0.1 - 1.0 K/uL   Eosinophils Relative 2  0 - 5 %   Eosinophils Absolute 0.3  0.0 - 0.7 K/uL   Basophils Relative 0  0 - 1 %   Basophils Absolute 0.1  0.0 - 0.1 K/uL  COMPREHENSIVE METABOLIC PANEL     Status: Abnormal   Collection Time    10/17/13  1:59 AM      Result Value Ref Range   Sodium 139  137 - 147 mEq/L   Potassium 4.2  3.7 - 5.3 mEq/L   Chloride 98  96 - 112 mEq/L   CO2 23  19 - 32 mEq/L   Glucose, Bld 83  70 - 99 mg/dL   BUN 9  6 - 23 mg/dL   Creatinine, Ser 0.61  0.50 - 1.35 mg/dL   Calcium 9.5  8.4 - 10.5 mg/dL   Total Protein 7.7  6.0 - 8.3 g/dL   Albumin 3.9  3.5 - 5.2 g/dL   AST 33  0 -  37 U/L   ALT 110 (*) 0 - 53 U/L   Alkaline Phosphatase 92  39 - 117 U/L   Total Bilirubin 0.4  0.3 - 1.2 mg/dL   GFR calc non Af Amer >90  >90 mL/min   GFR calc Af Amer >90  >90 mL/min   Comment: (NOTE)     The eGFR has been calculated using the CKD EPI equation.     This calculation has not been validated in all clinical situations.     eGFR's persistently <90 mL/min signify possible Chronic Kidney     Disease.   Anion  gap 18 (*) 5 - 15  LIPASE, BLOOD     Status: None   Collection Time    10/17/13  1:59 AM      Result Value Ref Range   Lipase 34  11 - 59 U/L  URINALYSIS, ROUTINE W REFLEX MICROSCOPIC     Status: Abnormal   Collection Time    10/17/13  4:55 AM      Result Value Ref Range   Color, Urine YELLOW  YELLOW   APPearance CLEAR  CLEAR   Specific Gravity, Urine 1.020  1.005 - 1.030   pH 5.5  5.0 - 8.0   Glucose, UA NEGATIVE  NEGATIVE mg/dL   Hgb urine dipstick NEGATIVE  NEGATIVE   Bilirubin Urine NEGATIVE  NEGATIVE   Ketones, ur 15 (*) NEGATIVE mg/dL   Protein, ur NEGATIVE  NEGATIVE mg/dL   Urobilinogen, UA 0.2  0.0 - 1.0 mg/dL   Nitrite NEGATIVE  NEGATIVE   Leukocytes, UA NEGATIVE  NEGATIVE   Comment: MICROSCOPIC NOT DONE ON URINES WITH NEGATIVE PROTEIN, BLOOD, LEUKOCYTES, NITRITE, OR GLUCOSE <1000 mg/dL.  I-STAT CG4 LACTIC ACID, ED     Status: Abnormal   Collection Time    10/17/13  3:29 PM      Result Value Ref Range   Lactic Acid, Venous 0.34 (*) 0.5 - 2.2 mmol/L   Ct Abdomen Pelvis Wo Contrast  10/17/2013   CLINICAL DATA:  Mid abdominal pain near the umbilicus. Appendix removed 2 weeks ago.  EXAM: CT ABDOMEN AND PELVIS WITHOUT CONTRAST  TECHNIQUE: Multidetector CT imaging of the abdomen and pelvis was performed following the standard protocol without IV contrast.  Evaluation of solid organs and vascular structures is limited without IV contrast material.  COMPARISON:  10/01/2013  FINDINGS: Lung bases are clear, allowing for motion artifact.  Again demonstrated,  there is lymphadenopathy in the inferior pericaval and right iliac chains into the right lower quadrant and pelvis with associated inflammatory infiltration in the retroperitoneal and right pelvic fat. Appearances are similar to the previous study. Enlarged and enhancing lymph nodes measure up to 3.2 cm diameter. There has been interval removal of the appendix. There is no loculated fluid or abscess. 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 Varices demonstrated in the subcutaneous fat over the right abdomen. The superior mesenteric vein is enlarged and venous thrombosis should be considered. Small inferior vena cava may represent vena caval stenosis or atrophy. Recommend followup with contrast-enhanced study or MRI for further evaluation.  Calcification in the right adrenal gland, likely indicating old infection or hemorrhage. The unenhanced appearance of the liver, spleen, gallbladder, pancreas, left adrenal gland, kidneys, and abdominal aorta is unremarkable. No free air or free fluid in the abdomen.  Bladder wall is not thickened. Prostate gland is not enlarged. No pelvic mass lesion. No abscess or free fluid. There are no destructive bone lesions.  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 Enlargement of the superior mesenteric vein. Venous thrombosis should be excluded. Consider contrast-enhanced CT or MRI for further evaluation. Extensive inflammatory changes in the right side of the retroperitoneum, right lower quadrant, and iliopsoas region of the pelvis, similar to previous study. Associated lymphadenopathy. Changes suggest inflammatory or infectious process. This may be related to the venous changes.  Since the previous study, the appendix is been removed. No evidence of abscess or free  air.   Electronically  Signed   By: Lucienne Capers M.D.   On: 10/17/2013 05:50   Mr Abdomen W Wo Contrast  10/17/2013   CLINICAL DATA:  Crampy abdominal pain status post recent appendectomy. Possible superior mesenteric vein thrombosis on CT.  EXAM: MRI ABDOMEN WITHOUT AND WITH CONTRAST  TECHNIQUE: Multiplanar multisequence MR imaging of the abdomen was performed both before and after the administration of intravenous contrast.  CONTRAST:  12m MULTIHANCE GADOBENATE DIMEGLUMINE 529 MG/ML IV SOLN  COMPARISON:  CT earlier today and 10/01/2013.  FINDINGS: There are multiple dilated venous collaterals within the retroperitoneum and mesentery. There is extensive nearly occlusive thrombus within the superior mesenteric vein and collateral vessels which drain into this vessel. There is no propagation of clot into the splenic or portal veins which are patent. The intrahepatic and infrahepatic IVC are very small and potentially chronically occluded. The hepatic veins and right atrium appear normal. The azygos vein is also dilated and there are prominent paravertebral collateral vessels.  There is diffuse mesenteric edema which has increased from the baseline CT. This extends into the right lower quadrant. As demonstrated on the original CT, there are several mildly enlarged mesenteric and retroperitoneal lymph nodes. However, many of these apparent enlarged lymph nodes are actually dilated collateral vessels.  The abdominal aorta is small in caliber. The celiac trunk, superior and inferior mesenteric arteries appear normal.  The liver, spleen, gallbladder, biliary system and pancreas appear normal. There is no adrenal mass. Right adrenal calcifications are present on CT. Both kidneys appear normal without hydronephrosis. There is no evidence of bowel obstruction, wall thickening or extraluminal fluid collection.  IMPRESSION: 1. There are findings suggesting chronic IVC occlusion or high-grade stenosis with multiple portosystemic collateral  vessels. There is extensive acute/subacute thrombosis of the superior mesenteric vein and collateral vessels draining into it. The cause of the IVC occlusion is inapparent, potentially congenital. 2. Diffuse mesenteric edema is present associated with this venous thrombosis. No focal extraluminal fluid collections identified. 3. In addition to the dilated vessels, there are few mildly prominent mesenteric lymph nodes which are probably reactive. 4. No evidence of bowel obstruction or bowel wall thickening. 5. These results were called by telephone at the time of interpretation on 10/17/2013 at 3:30 pm to Dr. DTawnya Crook who verbally acknowledged these results.   Electronically Signed   By: BCamie PatienceM.D.   On: 10/17/2013 15:32   Dg Abd Acute W/chest  10/17/2013   CLINICAL DATA:  Umbilical abdominal pain for 2 days. Nausea, emesis, constipation.  EXAM: ACUTE ABDOMEN SERIES (ABDOMEN 2 VIEW & CHEST 1 VIEW)  COMPARISON:  CT abdomen and pelvis 10/01/2013  FINDINGS: There is no evidence of dilated bowel loops or free intraperitoneal air. No radiopaque calculi or other significant radiographic abnormality is seen. Heart size and mediastinal contours are within normal limits. Both lungs are clear.  IMPRESSION: Negative abdominal radiographs.  No acute cardiopulmonary disease.   Electronically Signed   By: WLucienne CapersM.D.   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  Admit to [facility], will await Dr. WRicharda Bladerecommendations, but may need medical workup & heme workup 2.  NPO, bowel rest, IVF, pain control, antiemetics 3.  Dr. FOneida Alarfrom vascular was consulted and recommended IV heparin, this  has been started 4.  Unsure if this is a congenital, subacute, or  acute condition which has been brought to light given the recent lap appy and hypercoagulable state in the perioperative period.  No family history to give [place] any insight. 5.  Patient and Dad are aware of the plan thus far and are agreeable to admission   [name] [facility] Surgery 10/17/2013, 4:46 PM Pager: [phone]

## 2013-10-17 NOTE — ED Notes (Signed)
Pt placed back on monitor upon return to room from restroom. Pt monitored by blood pressure and pulse ox.

## 2013-10-17 NOTE — ED Provider Notes (Signed)
CSN: [id number]     Arrival date & time 10/17/13  0148 History   First MD Initiated Contact with Patient 10/17/13 0304     Chief Complaint  Patient presents with  . Abdominal Pain     (Consider location/radiation/quality/duration/timing/severity/associated sxs/prior Treatment) HPI  Patient presents to the counter appendectomy, now with one day of abdominal pain, decreased bowel output, one episode of vomiting. This episode began within the past few hours, has been progressive, with no relief other than the transient from Percocet. No concurrent fever, chills, confusion, disorientation, chest pain, dyspnea, other focal complaints.   Past Medical History  Diagnosis Date  . Appendicitis, acute 10/03/2013   Past Surgical History  Procedure Laterality Date  . Laparoscopy N/A 10/02/2013    Procedure: DIAGNOSTIC LAPAROSCOPY;  Surgeon: Harl Bowie, MD;  Location: [place];  Service: General;  Laterality: N/A;  . Laparoscopic appendectomy N/A 10/02/2013    Procedure: LAPAROSCOPIC APPENDECTOMY;  Surgeon: Harl Bowie, MD;  Location: [place];  Service: General;  Laterality: N/A;   No family history on file. History  Substance Use Topics  . Smoking status: Never Smoker   . Smokeless tobacco: Never Used  . Alcohol Use: Yes     Comment: 5-6 beers, 2 times a week    Review of Systems  Constitutional:       Per HPI, otherwise negative  HENT:       Per HPI, otherwise negative  Respiratory:       Per HPI, otherwise negative  Cardiovascular:       Per HPI, otherwise negative  Gastrointestinal: Positive for nausea, vomiting and abdominal pain. Negative for diarrhea.  Endocrine:       Negative aside from HPI  Genitourinary:       Neg aside from HPI   Musculoskeletal:       Per HPI, otherwise negative  Skin: Negative.   Neurological: Negative for syncope.      Allergies  Review of patient's allergies indicates no known allergies.  Home Medications   Prior to Admission  medications   Medication Sig Start Date End Date Taking? Authorizing Provider  acetaminophen (TYLENOL) 325 MG tablet Take 2 tablets (650 mg total) by mouth every 6 (six) hours as needed for mild pain (or Temp > 100). 10/03/13  Yes Earnstine Regal, PA-C  ibuprofen (ADVIL,MOTRIN) 200 MG tablet Take 200 mg by mouth every 6 (six) hours as needed for mild pain.   Yes Historical Provider, MD  oxyCODONE-acetaminophen (PERCOCET/ROXICET) 5-325 MG per tablet Take 1-2 tablets by mouth every 4 (four) hours as needed for moderate pain. 10/03/13  Yes Earnstine Regal, PA-C  senna (SENOKOT) 8.6 MG TABS tablet Take 1 tablet by mouth daily as needed for mild constipation.   Yes Historical Provider, MD  simethicone (MYLICON) 80 MG chewable tablet Chew 80 mg by mouth every 6 (six) hours as needed (for gas relief).   Yes Historical Provider, MD  amoxicillin-clavulanate (AUGMENTIN) 875-125 MG per tablet Take 1 tablet by mouth 2 (two) times daily. 10/03/13   Earnstine Regal, PA-C   BP 136/83  Pulse 89  Temp(Src) 98.8 F (37.1 C) (Oral)  Resp 16  Ht 5\' 9"  (1.753 m)  Wt 280 lb (127.007 kg)  BMI 41.33 kg/m2  SpO2 99% Physical Exam  Nursing note and vitals reviewed. Constitutional: He is oriented to person, place, and time. He appears well-developed. No distress.  HENT:  Head: Normocephalic and atraumatic.  Eyes: Conjunctivae and EOM are normal.  Cardiovascular: Normal  rate and regular rhythm.   Pulmonary/Chest: Effort normal. No stridor. No respiratory distress.  Abdominal: He exhibits no distension. There is no hepatosplenomegaly. There is generalized tenderness. There is no rigidity and no guarding.  Musculoskeletal: He exhibits no edema.  Neurological: He is alert and oriented to person, place, and time.  Skin: Skin is warm and dry.  Psychiatric: He has a normal mood and affect.    ED Course  Procedures (including critical care time) Labs Review Labs Reviewed  CBC WITH DIFFERENTIAL - Abnormal; Notable  for the following:    WBC 12.2 (*)    Platelets 460 (*)    Monocytes Absolute 1.4 (*)    All other components within normal limits  COMPREHENSIVE METABOLIC PANEL - Abnormal; Notable for the following:    ALT 110 (*)    Anion gap 18 (*)    All other components within normal limits  URINALYSIS, ROUTINE W REFLEX MICROSCOPIC - Abnormal; Notable for the following:    Ketones, ur 15 (*)    All other components within normal limits  LIPASE, BLOOD    Imaging Review Ct Abdomen Pelvis Wo Contrast  10/17/2013   CLINICAL DATA:  Mid abdominal pain near the umbilicus. Appendix removed 2 weeks ago.  EXAM: CT ABDOMEN AND PELVIS WITHOUT CONTRAST  TECHNIQUE: Multidetector CT imaging of the abdomen and pelvis was performed following the standard protocol without IV contrast.  Evaluation of solid organs and vascular structures is limited without IV contrast material.  COMPARISON:  10/01/2013  FINDINGS: Lung bases are clear, allowing for motion artifact.  Again demonstrated, there is lymphadenopathy in the inferior pericaval and right iliac chains into the right lower quadrant and pelvis with associated inflammatory infiltration in the retroperitoneal and right pelvic fat. Appearances are similar to the previous study. Enlarged and enhancing lymph nodes measure up to 3.2 cm diameter. There has been interval removal of the appendix. There is no loculated fluid or abscess. 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 Varices demonstrated in the subcutaneous fat over the right abdomen. The superior mesenteric vein is enlarged and venous thrombosis should be considered. Small inferior vena cava may represent vena caval stenosis or atrophy. Recommend followup with contrast-enhanced study or MRI for further evaluation.  Calcification in the right adrenal gland, likely indicating old infection or hemorrhage. The unenhanced appearance of the liver, spleen, gallbladder, pancreas, left  adrenal gland, kidneys, and abdominal aorta is unremarkable. No free air or free fluid in the abdomen.  Bladder wall is not thickened. Prostate gland is not enlarged. No pelvic mass lesion. No abscess or free fluid. There are no destructive bone lesions.  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 Enlargement of the superior mesenteric vein. Venous thrombosis should be excluded. Consider contrast-enhanced CT or MRI for further evaluation. Extensive inflammatory changes in the right side of the retroperitoneum, right lower quadrant, and iliopsoas region of the pelvis, similar to previous study. Associated lymphadenopathy. Changes suggest inflammatory or infectious process. This may be related to the venous changes.  Since the previous study, the appendix is been removed. No evidence of abscess or free air.   Electronically Signed   By: Lucienne Capers M.D.   On: 10/17/2013 05:50   Dg Abd Acute W/chest  10/17/2013   CLINICAL DATA:  Umbilical abdominal pain for 2 days. Nausea, emesis, constipation.  EXAM:  ACUTE ABDOMEN SERIES (ABDOMEN 2 VIEW & CHEST 1 VIEW)  COMPARISON:  CT abdomen and pelvis 10/01/2013  FINDINGS: There is no evidence of dilated bowel loops or free intraperitoneal air. No radiopaque calculi or other significant radiographic abnormality is seen. Heart size and mediastinal contours are within normal limits. Both lungs are clear.  IMPRESSION: Negative abdominal radiographs.  No acute cardiopulmonary disease.   Electronically Signed   By: Lucienne Capers M.D.   On: 10/17/2013 03:58    I reviewed the patient's chart.  Nondiagnostic x-ray, and after recurrence of severe pain in the patient's CT scan performed.  6:12 AM A repeat exam the patient is more comfortable.  We discussed all results thus far, including CT findings concerning for thrombosis or  inflammation.  MRI is pending MDM  This young male presents 2 weeks after appendectomy with new episode of abdominal pain, nausea, vomiting. Given his recent surgery, postop ileus, versus obstruction versus infection were all considerations.  Patient's initial evaluation did not demonstrate any of these, though there was abnormal CT scan suggesting inflammatory changes, possible thrombotic changes. On sign out, MRI was pending. Chart review demonstrates that the MRI was positive for likely thrombosis, diffuse inflammation, and the patient was admitted for further evaluation and management.    Carmin Muskrat, MD 10/18/13 [phone]

## 2013-10-17 NOTE — Consult Note (Signed)
#   [id number] is consult note.  He needs 4-5 days minimum of Heparin.   These SMV clots can be very "tricky" and can lead to bowel infarction if not treated aggressively.  I wonder if IR is needed to see about stenting the IVC??  I would get Dopplers of his legs to r/o DVT.  He will need at least 6 months - 1 year of anti-coagulation.  I think that Xarelto would be a good idea once the heparin drip is completed.  Hypercoagulable panel will take 5-7 days to return.  Would check Hepatis panel and HIV.  Aggressive pain control is needed as these SMV thrombi can be quite painful.  We will follow along!!  Pete E.  Romans 5:3-5

## 2013-10-17 NOTE — ED Notes (Signed)
Trauma MD at the bedside. 

## 2013-10-18 DIAGNOSIS — I82220 Acute embolism and thrombosis of inferior vena cava: Secondary | ICD-10-CM

## 2013-10-18 DIAGNOSIS — K55059 Acute (reversible) ischemia of intestine, part and extent unspecified: Secondary | ICD-10-CM

## 2013-10-18 LAB — HEPATITIS PANEL, ACUTE
HCV AB: NEGATIVE
HEP A IGM: NONREACTIVE
Hep B C IgM: NONREACTIVE
Hepatitis B Surface Ag: NEGATIVE

## 2013-10-18 LAB — CBC
HCT: 42.9 % (ref 39.0–52.0)
Hemoglobin: 14.5 g/dL (ref 13.0–17.0)
MCH: 30.5 pg (ref 26.0–34.0)
MCHC: 33.8 g/dL (ref 30.0–36.0)
MCV: 90.3 fL (ref 78.0–100.0)
PLATELETS: 412 10*3/uL — AB (ref 150–400)
RBC: 4.75 MIL/uL (ref 4.22–5.81)
RDW: 12.5 % (ref 11.5–15.5)
WBC: 13 10*3/uL — AB (ref 4.0–10.5)

## 2013-10-18 LAB — COMPREHENSIVE METABOLIC PANEL
ALT: 87 U/L — AB (ref 0–53)
AST: 28 U/L (ref 0–37)
Albumin: 3.6 g/dL (ref 3.5–5.2)
Alkaline Phosphatase: 85 U/L (ref 39–117)
Anion gap: 14 (ref 5–15)
BILIRUBIN TOTAL: 0.5 mg/dL (ref 0.3–1.2)
BUN: 5 mg/dL — ABNORMAL LOW (ref 6–23)
CHLORIDE: 100 meq/L (ref 96–112)
CO2: 24 meq/L (ref 19–32)
CREATININE: 0.55 mg/dL (ref 0.50–1.35)
Calcium: 9.4 mg/dL (ref 8.4–10.5)
GFR calc Af Amer: >90 mL/min (ref >90)
Glucose, Bld: 77 mg/dL (ref 70–99)
Potassium: 3.9 mEq/L (ref 3.7–5.3)
SODIUM: 138 meq/L (ref 137–147)
Total Protein: 7.5 g/dL (ref 6.0–8.3)

## 2013-10-18 LAB — HIV ANTIBODY (ROUTINE TESTING W REFLEX): HIV: NONREACTIVE

## 2013-10-18 LAB — AFP TUMOR MARKER: AFP-Tumor Marker: <1.3 ng/mL (ref 0.0–8.0)

## 2013-10-18 LAB — LACTATE DEHYDROGENASE: LDH: 180 U/L (ref 94–250)

## 2013-10-18 LAB — HEPARIN LEVEL (UNFRACTIONATED)
Heparin Unfractionated: 0.1 IU/mL — ABNORMAL LOW (ref 0.30–0.70)
Heparin Unfractionated: 0.15 IU/mL — ABNORMAL LOW (ref 0.30–0.70)
Heparin Unfractionated: 0.61 IU/mL (ref 0.30–0.70)

## 2013-10-18 LAB — HOMOCYSTEINE: HOMOCYSTEINE-NORM: 7.6 umol/L (ref 4.0–15.4)

## 2013-10-18 MED ORDER — HEPARIN BOLUS VIA INFUSION
3000.0000 [IU] | Freq: Once | INTRAVENOUS | Status: AC
Start: 2013-10-18 — End: 2013-10-18
  Administered 2013-10-18: 3000 [IU] via INTRAVENOUS
  Filled 2013-10-18: qty 3000

## 2013-10-18 MED ORDER — CETYLPYRIDINIUM CHLORIDE 0.05 % MT LIQD
7.0000 mL | Freq: Two times a day (BID) | OROMUCOSAL | Status: DC
  Administered 2013-10-20 – 2013-10-22 (×5): 7 mL via OROMUCOSAL

## 2013-10-18 MED ORDER — CHLORHEXIDINE GLUCONATE 0.12 % MT SOLN
15.0000 mL | Freq: Two times a day (BID) | OROMUCOSAL | Status: DC
  Administered 2013-10-18 – 2013-10-22 (×9): 15 mL via OROMUCOSAL
  Filled 2013-10-18 (×8): qty 15

## 2013-10-18 MED ORDER — HEPARIN BOLUS VIA INFUSION
3000.0000 [IU] | Freq: Once | INTRAVENOUS | Status: AC
  Administered 2013-10-18: 3000 [IU] via INTRAVENOUS
  Filled 2013-10-18: qty 3000

## 2013-10-18 NOTE — Progress Notes (Addendum)
ANTICOAGULATION CONSULT NOTE - Follow-up Consult  Pharmacy Consult:  Heparin Indication:  Intra-abdominal clot  No Known Allergies  Patient Measurements: Height: 5\' 9"  (175.3 cm) Weight: 280 lb (127.007 kg) IBW/kg (Calculated) : 70.7 Heparin Dosing Weight: 100 kg  Vital Signs: Temp: 98.3 F (36.8 C) (08/01 0936) Temp src: Oral (08/01 0936) BP: 133/59 mmHg (08/01 0936) Pulse Rate: 103 (08/01 0936)  Labs:  Recent Labs  10/17/13 0159 10/17/13 2114 10/18/13 0325 10/18/13 1020  HGB 15.1  --  14.5  --   HCT 44.6  --  42.9  --   PLT 460*  --  412*  --   HEPARINUNFRC  --  <0.10* 0.10* 0.15*  CREATININE 0.61  --  0.55  --     Estimated Creatinine Clearance: 190.9 ml/min (by C-G formula based on Cr of 0.55).  Assessment: 74 YOM with history of appendectomy 2 weeks ago presented with abdominal pain, nausea and vomiting.   Pt on heparin for SMV clot. Heparin level 0.15 (remains subtherapeutic despite aggressive increases). CBC stable. No bleeding noted. Hem-onc following - recommend 4-5 days of IV heparin then transition to Xarelto. Doppler of LE neg.  Goal of Therapy:  Heparin level 0.3-0.7 units/ml Monitor platelets by anticoagulation protocol: Yes    Plan:  - Rebolus Heparin 3000 units IV  - Increase heparin gtt to 2700 units/hr - Check 6 hr HL - Daily HL / CBC  Sherlon Handing, PharmD, BCPS Clinical pharmacist, pager [phone] 10/18/2013, 11:20 AM  Addendum:   Follow up heparin level on 2700 units/hr was therapeutic (0.6), nurse called to confirm that we should continue current rate which we will and check confirmatory level at midnight. No bleeding or IV issues noted.  No change in rate needed. Continue heparin at 2700 units/hr. Erin Hearing PharmD., BCPS Clinical Pharmacist Pager [phone] 10/18/2013 7:25 PM

## 2013-10-18 NOTE — Consult Note (Signed)
Jesse Underwood, SHONK NO.:  [id number]  MEDICAL RECORD NO.:  [id number]  LOCATION:  [id number]                        FACILITY:  [facility]  PHYSICIAN:  Volanda Napoleon, M.D.  DATE OF BIRTH:  [birth year]  DATE OF CONSULTATION: DATE OF DISCHARGE:                                CONSULTATION   REFERRING PHYSICIAN:  Gwenyth Ober, M.D.  REASON FOR CONSULTATION: 1. Acute superior mesenteric vein thrombus. 2. 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  HISTORY OF PRESENT ILLNESS:  Jesse Underwood is a 23 year old white male.  He recently graduated from college.  He really has no past medical history outside of a recent appendectomy. This was done on October 02, 2013.  This was done laparoscopically.  There was no obvious abnormal findings with the procedure.  Dr. Ninfa Linden done some slight turbid fluid in the abdomen.  The appendix appeared inflamed.  Otherwise, everything looked okay.  Jesse Underwood began to have some abdominal pain about 3 or 4 days ago. This was a crampy-type pain.  He began to have some emesis.  There was no bleeding.  He was little constipated.  There was no diarrhea.  He had no leg swelling.  He has been ambulatory.  He got to the point where the pain was so uncomfortable that he went to the emergency room.  CT scan done in the emergency room.  This showed stenosis of the inferior vena cava with enlargement of the superior mesenteric vein.  He then had an MRI.  The MRI showed chronic inferior vena cava occlusion or high-grade stenosis.  He had extensive acute/subacute thrombus of superior mesenteric vein.  He had mesenteric edema.  The liver, spleen, gallbladder all looked okay.  There was no hydronephrosis.  He was subsequently admitted and started on heparin.  He has no obvious history of hepatitis B or C.  He has no prior history of blood clots.  There was no history of blood clots in the family outside of a sibling who unfortunately has  been doing IV drugs.  He has had no weight loss or weight gain.  He has had no rashes.  He has had occasional headache.  He has had no cough.  He, I think, does drink alcohol on occasion.  He has a history of marijuana use.  I think, there was a past history of LSD.  His father was with him.  His father has been quite healthy.  We were asked to see him to see how we might be able to manage this situation.  PAST MEDICAL HISTORY:  Pretty much unremarkable outside the recent appendicitis.  ALLERGIES:  None.  HOME MEDICATIONS: 1. Percocet (5/325) 1-2 p.o. q.4 hours p.r.n. pain. 2. Senokot 1 p.o. as needed for constipation. 3. Augmentin 1 p.o. b.i.d. 4. Motrin 200 mg p.o. q.6 hours p.r.n.  SOCIAL HISTORY:  As stated previously.  Again, he never has smoked.  He does have some occasional alcohol use.  He has smoked marijuana.  He currently graduated from college.  FAMILY HISTORY:  Unremarkable for thromboembolic disease outside of his brother.  REVIEW OF SYSTEMS:  As stated in the history of present illness.  PHYSICAL  EXAMINATION:  GENERAL:  This is a somewhat obese, white gentleman in no obvious distress.  He is little uncomfortable from abdominal pain. VITAL SIGNS:  Temperature of 97.3, pulse 87, respiratory rate 16, blood pressure 156/95. HEAD AND NECK:  No ocular or oral lesions.  He has no palpable cervical or supraclavicular lymph nodes. LUNGS:  Clear bilaterally.  There are no rales, wheezes or rhonchi. CARDIAC:  Regular rate and rhythm with normal S1 and S2.  There are no murmurs, rubs, or bruits. ABDOMEN:  Somewhat obese.  His abdomen is mildly distended.  His bowel sounds are decreased.  He has some tenderness over on the right side of the abdomen.  No fluid wave is noted.  There is no palpable abdominal mass.  There is no palpable liver or spleen tip. BACK:  No tenderness over the spine, ribs, or hips. EXTREMITIES:  No clubbing, cyanosis, or edema.  No obvious  venous cord is noted at the legs.  He has good strength. SKIN:  No rashes, ecchymoses or petechiae. NEUROLOGIC:  No focal neurological deficits.  LABORATORY STUDIES:  White cell count is 12.2, hemoglobin 15.1, hematocrit 44.6, platelet count 460,000.  MCV is 91.  BUN 9, creatinine 0.61.  Calcium 9.5.  Albumin is 3.9.  SGPT 1, SGOT 34. Total protein 7.7.  IMPRESSION:  Jesse Underwood is a 23 year old gentleman with a superior mesenteric vein thrombus.  This appears to be acute in onset.  It is very unusual to see a thrombus in the superior mesenteric vein. He does not have obvious cirrhosis.  I would still check for hepatitis C and HIV.  I think these are indicated.  There is no family history of blood clots, but a hypercoagulable condition is a possibility.  I will send off a hypercoagulable panel.  A underlying myeloproliferative disorder of the bone marrow needs to be considered.  Polycythemia vera is a risk factor.  He has mild leukocytosis and thrombocytosis.  I suspect that these are reactive, but yet we need to send off a JAK2 assay.  I do not think any underlying malignancy is present.  In a young male, testicular cancer is always a possibility.  I would send off an LDH.  It certainly would not hurt to send off alpha-fetoprotein and beta HCG.  I would keep him on heparin for 4 or 5 days at the earliest.  I think this would be very helpful for him.  Pharmacy is managing the heparin. I would keep the heparin levels at the high end of therapeutic.  I think we can probably get him on one of the new oral anticoagulants. I think Xarelto would be helpful.  I think this would be well tolerated. I think the risk of bleeding would be minimal.  I would recommend at least 6 months of anticoagulation.  We will see what his hypercoagulable studies show.  If we find some of that might indicate a hereditary condition, then I would certainly consider 1 year of anticoagulation.  I had a  nice time with Mr. Huttner and his dad.  I answered all of their questions.  I would probably get Dopplers of his legs just to be thorough.  We will help follow along.  Again, I would have him on heparin for a minimum of 4-5 days.  I believe this is highly active with these mesenteric thrombus.  I told Mr. Bruington that he likely will have abdominal pain for a good month or so.  He has some poor  venous return right now that is causing his intestines to become "boggy."  This effects his digestive process. I do not think he will be able to eat a lot at one time.  I think he will have to go with 4 or 5 small meals a day.  I would try to keep him on a low-protein diet right now.  Again, we will follow along and provide any input as needed.     Volanda Napoleon, M.D.     PRE/MEDQ  D:  10/17/2013  T:  10/18/2013  Job:  [id number]

## 2013-10-18 NOTE — Progress Notes (Signed)
ANTICOAGULATION CONSULT NOTE - Follow Up Consult  Pharmacy Consult for heparin Indication: intraabdominal clot  Labs:  Recent Labs  10/17/13 0159 10/17/13 2114 10/18/13 0325  HGB 15.1  --  14.5  HCT 44.6  --  42.9  PLT 460*  --  412*  HEPARINUNFRC  --  <0.10* 0.10*  CREATININE 0.61  --   --     Assessment: 23yo male remains subtherapeutic on heparin after rebolus and rate increase.  Goal of Therapy:  Heparin level 0.3-0.7 units/ml   Plan:  Will rebolus with heparin 3000 units and increase gtt by 4 units/kg/hr to 2300 units/hr and check level in 6hr.  Wynona Neat, PharmD, BCPS  10/18/2013,4:24 AM

## 2013-10-18 NOTE — Progress Notes (Signed)
VASCULAR LAB PRELIMINARY  PRELIMINARY  PRELIMINARY  PRELIMINARY  Bilateral lower extremity venous Dopplers completed.    Preliminary report:  There is no DVT or SVT noted in the bilateral lower extremities.   Arias Weinert, RVT 10/18/2013, 9:29 AM

## 2013-10-18 NOTE — Progress Notes (Signed)
Subjective: Still with abdominal pain, though less  Objective: Vital signs in last 24 hours: Temp:  [97.3 F (36.3 C)-99.1 F (37.3 C)] 98.3 F (36.8 C) (08/01 0936) Pulse Rate:  [77-103] 103 (08/01 0936) Resp:  [16-18] 18 (08/01 0936) BP: (113-160)/(55-95) 133/59 mmHg (08/01 0936) SpO2:  [90 %-98 %] 95 % (08/01 0936) Last BM Date: 10/17/13  Intake/Output from previous day: 07/31 0701 - 08/01 0700 In: 2000 [I.V.:2000] Out: 875 [Urine:875] Intake/Output this shift:    Abdomen soft, minimally tender throughout No peritonitis  Lab Results:   Recent Labs  10/17/13 0159 10/18/13 0325  WBC 12.2* 13.0*  HGB 15.1 14.5  HCT 44.6 42.9  PLT 460* 412*   BMET  Recent Labs  10/17/13 0159 10/18/13 0325  NA 139 138  K 4.2 3.9  CL 98 100  CO2 23 24  GLUCOSE 83 77  BUN 9 5*  CREATININE 0.61 0.55  CALCIUM 9.5 9.4   PT/INR No results found for this basename: LABPROT, INR,  in the last 72 hours ABG No results found for this basename: PHART, PCO2, PO2, HCO3,  in the last 72 hours  Studies/Results: Ct Abdomen Pelvis Wo Contrast  10/17/2013   CLINICAL DATA:  Mid abdominal pain near the umbilicus. Appendix removed 2 weeks ago.  EXAM: CT ABDOMEN AND PELVIS WITHOUT CONTRAST  TECHNIQUE: Multidetector CT imaging of the abdomen and pelvis was performed following the standard protocol without IV contrast.  Evaluation of solid organs and vascular structures is limited without IV contrast material.  COMPARISON:  10/01/2013  FINDINGS: Lung bases are clear, allowing for motion artifact.  Again demonstrated, there is lymphadenopathy in the inferior pericaval and right iliac chains into the right lower quadrant and pelvis with associated inflammatory infiltration in the retroperitoneal and right pelvic fat. Appearances are similar to the previous study. Enlarged and enhancing lymph nodes measure up to 3.2 cm diameter. There has been interval removal of the appendix. There is no loculated  fluid or abscess. 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 Varices demonstrated in the subcutaneous fat over the right abdomen. The superior mesenteric vein is enlarged and venous thrombosis should be considered. Small inferior vena cava may represent vena caval stenosis or atrophy. Recommend followup with contrast-enhanced study or MRI for further evaluation.  Calcification in the right adrenal gland, likely indicating old infection or hemorrhage. The unenhanced appearance of the liver, spleen, gallbladder, pancreas, left adrenal gland, kidneys, and abdominal aorta is unremarkable. No free air or free fluid in the abdomen.  Bladder wall is not thickened. Prostate gland is not enlarged. No pelvic mass lesion. No abscess or free fluid. There are no destructive bone lesions.  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 Enlargement of the superior mesenteric vein. Venous thrombosis should be excluded. Consider contrast-enhanced CT or MRI for further evaluation. Extensive inflammatory changes in the right side of the retroperitoneum, right lower quadrant, and iliopsoas region of the pelvis, similar to previous study. Associated lymphadenopathy. Changes suggest inflammatory or infectious process. This may be related to the venous changes.  Since the previous study, the appendix is been removed. No evidence of abscess or free air.   Electronically Signed   By: Lucienne Capers M.D.   On: 10/17/2013 05:50   Mr Abdomen W Wo Contrast  10/17/2013   CLINICAL DATA:  Crampy abdominal pain  status post recent appendectomy. Possible superior mesenteric vein thrombosis on CT.  EXAM: MRI ABDOMEN WITHOUT AND WITH CONTRAST  TECHNIQUE: Multiplanar multisequence MR imaging of the abdomen was performed both before and after the administration of  intravenous contrast.  CONTRAST:  4mL MULTIHANCE GADOBENATE DIMEGLUMINE 529 MG/ML IV SOLN  COMPARISON:  CT earlier today and 10/01/2013.  FINDINGS: There are multiple dilated venous collaterals within the retroperitoneum and mesentery. There is extensive nearly occlusive thrombus within the superior mesenteric vein and collateral vessels which drain into this vessel. There is no propagation of clot into the splenic or portal veins which are patent. The intrahepatic and infrahepatic IVC are very small and potentially chronically occluded. The hepatic veins and right atrium appear normal. The azygos vein is also dilated and there are prominent paravertebral collateral vessels.  There is diffuse mesenteric edema which has increased from the baseline CT. This extends into the right lower quadrant. As demonstrated on the original CT, there are several mildly enlarged mesenteric and retroperitoneal lymph nodes. However, many of these apparent enlarged lymph nodes are actually dilated collateral vessels.  The abdominal aorta is small in caliber. The celiac trunk, superior and inferior mesenteric arteries appear normal.  The liver, spleen, gallbladder, biliary system and pancreas appear normal. There is no adrenal mass. Right adrenal calcifications are present on CT. Both kidneys appear normal without hydronephrosis. There is no evidence of bowel obstruction, wall thickening or extraluminal fluid collection.  IMPRESSION: 1. 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 There is extensive acute/subacute thrombosis of the superior mesenteric vein and collateral vessels draining into it. The cause of the IVC occlusion is inapparent, potentially congenital. 2. Diffuse mesenteric edema is present associated with this venous thrombosis. No focal extraluminal fluid collections identified. 3. In addition to the dilated vessels, there are few mildly  prominent mesenteric lymph nodes which are probably reactive. 4. No evidence of bowel obstruction or bowel wall thickening. 5. These results were called by telephone at the time of interpretation on 10/17/2013 at 3:30 pm to Dr. Tawnya Crook, who verbally acknowledged these results.   Electronically Signed   By: Camie Patience M.D.   On: 10/17/2013 15:32   Dg Abd Acute W/chest  10/17/2013   CLINICAL DATA:  Umbilical abdominal pain for 2 days. Nausea, emesis, constipation.  EXAM: ACUTE ABDOMEN SERIES (ABDOMEN 2 VIEW & CHEST 1 VIEW)  COMPARISON:  CT abdomen and pelvis 10/01/2013  FINDINGS: There is no evidence of dilated bowel loops or free intraperitoneal air. No radiopaque calculi or other significant radiographic abnormality is seen. Heart size and mediastinal contours are within normal limits. Both lungs are clear.  IMPRESSION: Negative abdominal radiographs.  No acute cardiopulmonary disease.   Electronically Signed   By: Lucienne Capers M.D.   On: 10/17/2013 03:58    Anti-infectives: Anti-infectives   None      Assessment/Plan:  Appreciate Hem/Onc's input  Continue IV heparin Allow clear liquids  LOS: 1 day    Jlee Harkless A 10/18/2013

## 2013-10-18 NOTE — Consult Note (Signed)
Spoke with Dr. Marin Olp who will see the patient in consultation from Hematology.  Right now we will need to heparinize the patient and see if this will resolve  No need for acute surgical management.  Jesse Underwood. Dahlia Bailiff, MD, [place] [phone] [facility] Surgery

## 2013-10-19 LAB — CBC
HCT: 40.5 % (ref 39.0–52.0)
HEMATOCRIT: 40.1 % (ref 39.0–52.0)
HEMOGLOBIN: 13.5 g/dL (ref 13.0–17.0)
Hemoglobin: 13.5 g/dL (ref 13.0–17.0)
MCH: 30.3 pg (ref 26.0–34.0)
MCH: 31 pg (ref 26.0–34.0)
MCHC: 33.7 g/dL (ref 30.0–36.0)
MCHC: 33.3 g/dL (ref 30.0–36.0)
MCV: 91 fL (ref 78.0–100.0)
MCV: 92 fL (ref 78.0–100.0)
PLATELETS: 358 10*3/uL (ref 150–400)
Platelets: 331 10*3/uL (ref 150–400)
RBC: 4.36 MIL/uL (ref 4.22–5.81)
RBC: 4.45 MIL/uL (ref 4.22–5.81)
RDW: 12.6 % (ref 11.5–15.5)
RDW: 12.5 % (ref 11.5–15.5)
WBC: 11.5 10*3/uL — AB (ref 4.0–10.5)
WBC: 12 10*3/uL — ABNORMAL HIGH (ref 4.0–10.5)

## 2013-10-19 LAB — LACTATE DEHYDROGENASE: LDH: 227 U/L (ref 94–250)

## 2013-10-19 LAB — HEPARIN LEVEL (UNFRACTIONATED)
Heparin Unfractionated: 0.37 IU/mL (ref 0.30–0.70)
Heparin Unfractionated: 0.54 IU/mL (ref 0.30–0.70)

## 2013-10-19 NOTE — Progress Notes (Signed)
[facility] Progress Note     Subjective: Pt c/o continued pain.  Getting 2-3mg  dilaudid every 2-2.5hr.  +nausea.  Feels bloated.  Ambulated once yesterday.  Tolerating clears.  Father at bedside, they ask very appropriate questions.    Father says his brother had a stroke and has also had DVT's and PE's, but they thought it was because he was an IVDU, but it appears now he may also need an extensive hematologic workup as well in light of what's going on with Jesse Underwood.  His dad is interested in getting Jesse Underwood's brother to see Dr. Marin Olp as well.    Objective: Vital signs in last 24 hours: Temp:  [98.3 F (36.8 C)-100 F (37.8 C)] 100 F (37.8 C) (08/02 0500) Pulse Rate:  [96-103] 99 (08/02 0500) Resp:  [17-18] 17 (08/02 0500) BP: (111-147)/(59-84) 146/71 mmHg (08/02 0500) SpO2:  [93 %-95 %] 93 % (08/02 0500) Last BM Date: 10/17/13  Intake/Output from previous day: 08/01 0701 - 08/02 0700 In: 1260 [P.O.:360; I.V.:900] Out: 300 [Urine:300] Intake/Output this shift: Total I/O In: -  Out: 600 [Urine:600]  PE: Gen:  Alert, NAD, pleasant Abd: Soft, moderate tenderness, mild distension, +BS, no HSM, no abdominal scars noted   Lab Results:   Recent Labs  10/19/13 0020 10/19/13 0258  WBC 12.0* 11.5*  HGB 13.5 13.5  HCT 40.5 40.1  PLT 331 358   BMET  Recent Labs  10/17/13 0159 10/18/13 0325  NA 139 138  K 4.2 3.9  CL 98 100  CO2 23 24  GLUCOSE 83 77  BUN 9 5*  CREATININE 0.61 0.55  CALCIUM 9.5 9.4   PT/INR No results found for this basename: LABPROT, INR,  in the last 72 hours CMP     Component Value Date/Time   NA 138 10/18/2013 0325   K 3.9 10/18/2013 0325   CL 100 10/18/2013 0325   CO2 24 10/18/2013 0325   GLUCOSE 77 10/18/2013 0325   BUN 5* 10/18/2013 0325   CREATININE 0.55 10/18/2013 0325   CALCIUM 9.4 10/18/2013 0325   PROT 7.5 10/18/2013 0325   ALBUMIN 3.6 10/18/2013 0325   AST 28 10/18/2013 0325   ALT 87* 10/18/2013 0325   ALKPHOS 85 10/18/2013 0325    BILITOT 0.5 10/18/2013 0325   GFRNONAA >90 10/18/2013 0325   GFRAA >90 10/18/2013 0325   Lipase     Component Value Date/Time   LIPASE 34 10/17/2013 0159       Studies/Results: Mr Abdomen W Wo Contrast  10/17/2013   CLINICAL DATA:  Crampy abdominal pain status post recent appendectomy. Possible superior mesenteric vein thrombosis on CT.  EXAM: MRI ABDOMEN WITHOUT AND WITH CONTRAST  TECHNIQUE: Multiplanar multisequence MR imaging of the abdomen was performed both before and after the administration of intravenous contrast.  CONTRAST:  74mL MULTIHANCE GADOBENATE DIMEGLUMINE 529 MG/ML IV SOLN  COMPARISON:  CT earlier today and 10/01/2013.  FINDINGS: There are multiple dilated venous collaterals within the retroperitoneum and mesentery. There is extensive nearly occlusive thrombus within the superior mesenteric vein and collateral vessels which drain into this vessel. There is no propagation of clot into the splenic or portal veins which are patent. The intrahepatic and infrahepatic IVC are very small and potentially chronically occluded. The hepatic veins and right atrium appear normal. The azygos vein is also dilated and there are prominent paravertebral collateral vessels.  There is diffuse mesenteric edema which has increased from the baseline CT. This extends into the right lower quadrant.  As demonstrated on the original CT, there are several mildly enlarged mesenteric and retroperitoneal lymph nodes. However, many of these apparent enlarged lymph nodes are actually dilated collateral vessels.  The abdominal aorta is small in caliber. The celiac trunk, superior and inferior mesenteric arteries appear normal.  The liver, spleen, gallbladder, biliary system and pancreas appear normal. There is no adrenal mass. Right adrenal calcifications are present on CT. Both kidneys appear normal without hydronephrosis. There is no evidence of bowel obstruction, wall thickening or extraluminal fluid collection.   IMPRESSION: 1. 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 There is extensive acute/subacute thrombosis of the superior mesenteric vein and collateral vessels draining into it. The cause of the IVC occlusion is inapparent, potentially congenital. 2. Diffuse mesenteric edema is present associated with this venous thrombosis. No focal extraluminal fluid collections identified. 3. In addition to the dilated vessels, there are few mildly prominent mesenteric lymph nodes which are probably reactive. 4. No evidence of bowel obstruction or bowel wall thickening. 5. These results were called by telephone at the time of interpretation on 10/17/2013 at 3:30 pm to Dr. Tawnya Crook, who verbally acknowledged these results.   Electronically Signed   By: Camie Patience M.D.   On: 10/17/2013 15:32    Anti-infectives: Anti-infectives   None       Assessment/Plan Severe central abdominal pain  Nausea/vomiting  Chronic IVC or high grade stenosis  Acute/subacute thrombosis of SMV  Diffuse mesenteric edema  Prominent mesenteric lymph nodes  Obesity - BMI 41  Marijuana use  Alcohol use  H/o polysubstance abuse   Plan:  1.  Continue clear liquids only due to significant pain 2.  Appreciate Dr. Dicie Beam recommendations and workup, continue heparin for 4-5 days then switch to Xarelto for at least 6 mo at discharge 3.  Try 4-5 smaller meals a day to help minimize pain, low protein diet 4.  Awaiting many lab results, maybe home on Wednesday 5.  May need lymph node biopsy at some point   LOS: 2 days    DORT, [facility] 10/19/2013, 9:23 AM Pager: [phone]

## 2013-10-19 NOTE — Progress Notes (Signed)
ANTICOAGULATION CONSULT NOTE - Follow Up Consult  Pharmacy Consult for heparin Indication: intraabdominal clot  Labs:  Recent Labs  10/17/13 0159  10/18/13 0325 10/18/13 1020 10/18/13 1830 10/19/13 0020  HGB 15.1  --  14.5  --   --  13.5  HCT 44.6  --  42.9  --   --  40.5  PLT 460*  --  412*  --   --  331  HEPARINUNFRC  --   < > 0.10* 0.15* 0.61 0.54  CREATININE 0.61  --  0.55  --   --   --   < > = values in this interval not displayed.   Assessment/Plan:  23yo male remains therapeutic on heparin. Will continue gtt at current rate and continue to monitor.   Wynona Neat, PharmD, BCPS  10/19/2013,1:13 AM

## 2013-10-19 NOTE — Progress Notes (Signed)
ANTICOAGULATION CONSULT NOTE - Follow-up Consult  Pharmacy Consult:  Heparin Indication:  Intra-abdominal clot  No Known Allergies  Patient Measurements: Height: 5\' 9"  (175.3 cm) Weight: 280 lb (127.007 kg) IBW/kg (Calculated) : 70.7 Heparin Dosing Weight: 100 kg  Vital Signs: Temp: 100 F (37.8 C) (08/02 0500) Temp src: Oral (08/02 0500) BP: 146/71 mmHg (08/02 0500) Pulse Rate: 99 (08/02 0500)  Labs:  Recent Labs  10/17/13 0159  10/18/13 0325  10/18/13 1830 10/19/13 0020 10/19/13 0258  HGB 15.1  --  14.5  --   --  13.5 13.5  HCT 44.6  --  42.9  --   --  40.5 40.1  PLT 460*  --  412*  --   --  331 358  HEPARINUNFRC  --   < > 0.10*  < > 0.61 0.54 0.37  CREATININE 0.61  --  0.55  --   --   --   --   < > = values in this interval not displayed.  Estimated Creatinine Clearance: 190.9 ml/min (by C-G formula based on Cr of 0.55).  Assessment: 7 YOM with history of appendectomy 2 weeks ago presented with abdominal pain, nausea and vomiting. Pt on heparin for SMV clot. Heparin level 0.37 (therapeutic). CBC stable. No bleeding noted. Hem-onc following - recommend 4-5 days of IV heparin (today is Day #3) then transition to Xarelto. Doppler of LE neg.  Goal of Therapy:  Heparin level 0.3-0.7 units/ml Monitor platelets by anticoagulation protocol: Yes    Plan:  - Continue heparin gtt to 2700 units/hr - Daily HL / CBC  Sherlon Handing, PharmD, BCPS Clinical pharmacist, pager [phone] 10/19/2013, 9:46 AM

## 2013-10-19 NOTE — Progress Notes (Signed)
Seen, agree with above. Hypercoagulable workup per Dr./ Marin Olp for SMV thrombosis.  .  Stay on clears for now.

## 2013-10-20 DIAGNOSIS — Z7901 Long term (current) use of anticoagulants: Secondary | ICD-10-CM

## 2013-10-20 LAB — HEPARIN LEVEL (UNFRACTIONATED)
HEPARIN UNFRACTIONATED: 0.12 [IU]/mL — AB (ref 0.30–0.70)
HEPARIN UNFRACTIONATED: 0.2 [IU]/mL — AB (ref 0.30–0.70)
Heparin Unfractionated: 0.15 IU/mL — ABNORMAL LOW (ref 0.30–0.70)

## 2013-10-20 LAB — CBC
HEMATOCRIT: 35.5 % — AB (ref 39.0–52.0)
HEMOGLOBIN: 11.9 g/dL — AB (ref 13.0–17.0)
MCH: 30.4 pg (ref 26.0–34.0)
MCHC: 33.5 g/dL (ref 30.0–36.0)
MCV: 90.6 fL (ref 78.0–100.0)
Platelets: 334 10*3/uL (ref 150–400)
RBC: 3.92 MIL/uL — ABNORMAL LOW (ref 4.22–5.81)
RDW: 12.4 % (ref 11.5–15.5)
WBC: 9.5 10*3/uL (ref 4.0–10.5)

## 2013-10-20 LAB — LACTATE DEHYDROGENASE: LDH: 158 U/L (ref 94–250)

## 2013-10-20 LAB — PROTEIN C, TOTAL: Protein C, Total: 158 % (ref 72–160)

## 2013-10-20 LAB — PROTEIN S, TOTAL: PROTEIN S AG TOTAL: 115 % (ref 60–150)

## 2013-10-20 MED ORDER — HEPARIN BOLUS VIA INFUSION
3000.0000 [IU] | Freq: Once | INTRAVENOUS | Status: AC
  Administered 2013-10-20: 3000 [IU] via INTRAVENOUS
  Filled 2013-10-20: qty 3000

## 2013-10-20 MED ORDER — HEPARIN (PORCINE) IN NACL 100-0.45 UNIT/ML-% IJ SOLN
3500.0000 [IU]/h | INTRAMUSCULAR | Status: DC
  Administered 2013-10-20 – 2013-10-21 (×2): 3600 [IU]/h via INTRAVENOUS
  Administered 2013-10-21 (×2): 3500 [IU]/h via INTRAVENOUS
  Administered 2013-10-21: 3600 [IU]/h via INTRAVENOUS
  Administered 2013-10-22: 3500 [IU]/h via INTRAVENOUS
  Filled 2013-10-20 (×6): qty 250

## 2013-10-20 MED ORDER — OXYCODONE-ACETAMINOPHEN 5-325 MG PO TABS
1.0000 | ORAL_TABLET | ORAL | Status: DC | PRN
  Administered 2013-10-20 – 2013-10-22 (×11): 2 via ORAL
  Filled 2013-10-20 (×11): qty 2

## 2013-10-20 NOTE — Progress Notes (Signed)
Pt seeng this evening around 7pm No c/o  Alert, nad Obese, soft, not really TTP  Doing well Home when cleared by hematology Discussed importance of compliance with blood thinners  Jesse Underwood. Redmond Pulling, MD, FACS General, Bariatric, & Minimally Invasive Surgery [facility], [place]

## 2013-10-20 NOTE — Progress Notes (Signed)
Patient ID: Jesse Underwood, male   DOB: 1990-05-27, 23 y.o.   MRN: [id number]    Subjective: Patient actually feels quite well today. Says his pain is well controlled. He is tolerating full liquids and would like to try solid food.  Objective: Vital signs in last 24 hours: Temp:  [97.9 F (36.6 C)-99.3 F (37.4 C)] 97.9 F (36.6 C) (08/03 0523) Pulse Rate:  [95-103] 95 (08/03 0523) Resp:  [18] 18 (08/03 0523) BP: (129-142)/(67-75) 138/67 mmHg (08/03 0523) SpO2:  [95 %-98 %] 98 % (08/03 0523) Last BM Date: 10/17/13  Intake/Output from previous day: 08/02 0701 - 08/03 0700 In: 3502.3 [P.O.:840; I.V.:2662.3] Out: 4150 [Urine:4150] Intake/Output this shift: Total I/O In: -  Out: 750 [Urine:750]  PE: Abd: Soft, minimally tender, active bowel sounds, nondistended  Lab Results:   Recent Labs  10/19/13 0258 10/20/13 0440  WBC 11.5* 9.5  HGB 13.5 11.9*  HCT 40.1 35.5*  PLT 358 334   BMET  Recent Labs  10/18/13 0325  NA 138  K 3.9  CL 100  CO2 24  GLUCOSE 77  BUN 5*  CREATININE 0.55  CALCIUM 9.4   PT/INR No results found for this basename: LABPROT, INR,  in the last 72 hours CMP     Component Value Date/Time   NA 138 10/18/2013 0325   K 3.9 10/18/2013 0325   CL 100 10/18/2013 0325   CO2 24 10/18/2013 0325   GLUCOSE 77 10/18/2013 0325   BUN 5* 10/18/2013 0325   CREATININE 0.55 10/18/2013 0325   CALCIUM 9.4 10/18/2013 0325   PROT 7.5 10/18/2013 0325   ALBUMIN 3.6 10/18/2013 0325   AST 28 10/18/2013 0325   ALT 87* 10/18/2013 0325   ALKPHOS 85 10/18/2013 0325   BILITOT 0.5 10/18/2013 0325   GFRNONAA >90 10/18/2013 0325   GFRAA >90 10/18/2013 0325   Lipase     Component Value Date/Time   LIPASE 34 10/17/2013 0159       Studies/Results: No results found.  Anti-infectives: Anti-infectives   None       Assessment/Plan central abdominal pain  Nausea/vomiting  Chronic IVC or high grade stenosis  Acute/subacute thrombosis of SMV  Diffuse mesenteric edema  Prominent  mesenteric lymph nodes  Obesity - BMI 41  Marijuana use  Alcohol use  H/o polysubstance abuse   Plan: 1. Patient seems quite comfortable today. Advanced to a regular diet and convert him to oral pain medicine. 2. Appreciate Dr. Antonieta Pert recommendations. He would like him to be on heparin for another 2 days. We'll then convert him to Xarelto 15 mg twice a day.   LOS: 3 days    Ferrah Panagopoulos E 10/20/2013, 9:28 AM Pager: [phone]

## 2013-10-20 NOTE — Progress Notes (Signed)
Mr. Sculley has less abdominal pain. He still is on clear liquids. He is getting some smoothies and yogurt. He's not having any problems with these. As such, he probably can increase him to full liquids.  Is having less abdominal pain. He's taking less pain medication.  His abdomen is still somewhat distended. His bowel sounds are very diminished. I will her a abdominal film. It is possible he may have a little bit of an ileus secondary to poor vascular drainage of the intestines.  None of his hypercoagulable studies have come back yet outside of the antithrombin III and homocystine. These are normal.  His heparin is subtherapeutic at this morning. Pharmacy is managing this.  His Dopplers of his legs are negative.  I suspect that we may not find the etiology of this thrombus. I would think that his hypercoagulable studies will be normal. However we will await the results.  I would keep him on IV heparin for another 2 days. I would then get him onto Xarelto at 15 mg twice a day.  His vital signs are all stable. Blood pressure 138/67. Temperature 97.9. Lungs are clear. Cardiac exam regular rate and rhythm with no murmurs rubs or bruits. Abdomen is slightly distended. Bowel sounds are decreased. He has no guarding. There may be some slight tenderness over the right side to palpation. There is no palpable liver or spleen tip. Extremities shows no clubbing cyanosis or edema.  His superior mesenteric vein thrombus should respond well to the heparin and then Xarelto.  I suspect that he will need at least 6 months of anticoagulation. If we do find a thrombophilic state, then probably will go with one year of anticoagulation.  Lum Keas  Psalm 138:7

## 2013-10-20 NOTE — Progress Notes (Signed)
ANTICOAGULATION CONSULT NOTE - Follow Up Consult  Pharmacy Consult for heparin Indication: intraabdominal clot  Labs:  Recent Labs  10/18/13 0325  10/19/13 0020 10/19/13 0258 10/20/13 0440  HGB 14.5  --  13.5 13.5 11.9*  HCT 42.9  --  40.5 40.1 35.5*  PLT 412*  --  331 358 334  HEPARINUNFRC 0.10*  < > 0.54 0.37 0.12*  CREATININE 0.55  --   --   --   --   < > = values in this interval not displayed.   Assessment: 23yo male now subtherapeutic on heparin after two levels at goal though had been trending down and new bag started last pm (using house-made gtts which tend to be more variable than premade bags).  Goal of Therapy:  Heparin level 0.3-0.7 units/ml   Plan:  Will rebolus with heparin 3000 units and increase gtt by 2-3 units/kg/hr to 3000 units/hr and check level in 6hr.  Wynona Neat, PharmD, BCPS  10/20/2013,5:49 AM

## 2013-10-20 NOTE — Progress Notes (Addendum)
ANTICOAGULATION CONSULT NOTE - Follow-up Consult  Pharmacy Consult:  Heparin Indication:  Intra-abdominal clot  No Known Allergies  Patient Measurements: Height: 5\' 9"  (175.3 cm) Weight: 280 lb (127.007 kg) IBW/kg (Calculated) : 70.7 Heparin Dosing Weight: 100 kg  Vital Signs: Temp: 97.9 F (36.6 C) (08/03 0523) Temp src: Oral (08/03 0523) BP: 138/67 mmHg (08/03 0523) Pulse Rate: 95 (08/03 0523)  Labs:  Recent Labs  10/18/13 0325  10/19/13 0020 10/19/13 0258 10/20/13 0440 10/20/13 1145  HGB 14.5  --  13.5 13.5 11.9*  --   HCT 42.9  --  40.5 40.1 35.5*  --   PLT 412*  --  331 358 334  --   HEPARINUNFRC 0.10*  < > 0.54 0.37 0.12* 0.15*  CREATININE 0.55  --   --   --   --   --   < > = values in this interval not displayed.  Estimated Creatinine Clearance: 190.9 ml/min (by C-G formula based on Cr of 0.55).  Assessment: 43 YOM with history of appendectomy 2 weeks ago presented with abdominal pain, nausea and vomiting. Pt on heparin for SMV clot- hematology workup is underway right now. Plan is to continue heparin for 2 more days and then transition to Xarelto.  His heparin level early this morning was low and the rate was increased. A level was rechecked and was still low at 0.15 units/mL. Confirmed with RN the drip was not held and there have not been any issues with the line. We are using hand-made heparin bags which can potentially cause more variability than pre-mixed bags, likely the cause for his level to drop after being therapeutic. Hgb dropped slightly, platelets stable. No bleeding noted.  Goal of Therapy:  Heparin level 0.3-0.7 units/ml Monitor platelets by anticoagulation protocol: Yes   Plan:  1. Re-bolus with 3000 units heparin IV x1 2. Increase heparin gtt to 3300 units/hr 3. Heparin level in 6 hours 4. Daily HL and CBC 5. Follow for transition to [place] D. Bajbus, PharmD, BCPS Clinical Pharmacist Pager: [phone] 10/20/2013 12:59  PM   Addendum: 6 hour heparin level is increased but still remains below goal at 0.20 on 3300 units/hr. No issues with infusion per RN. No bleeding reported.   Plan: 1) Re-bolus heparin 3000 units x 1 2) Increase drip to 3600 units/hr 3) Check 6 hour heparin level  Nena Jordan, PharmD, BCPS 10/20/2013, 8:52 PM

## 2013-10-21 ENCOUNTER — Inpatient Hospital Stay (HOSPITAL_COMMUNITY): Payer: BC Managed Care – PPO

## 2013-10-21 DIAGNOSIS — I82891 Chronic embolism and thrombosis of other specified veins: Secondary | ICD-10-CM

## 2013-10-21 LAB — CBC
HEMATOCRIT: 37.4 % — AB (ref 39.0–52.0)
HEMOGLOBIN: 12.2 g/dL — AB (ref 13.0–17.0)
MCH: 29.6 pg (ref 26.0–34.0)
MCHC: 32.6 g/dL (ref 30.0–36.0)
MCV: 90.8 fL (ref 78.0–100.0)
Platelets: 335 10*3/uL (ref 150–400)
RBC: 4.12 MIL/uL — ABNORMAL LOW (ref 4.22–5.81)
RDW: 12.5 % (ref 11.5–15.5)
WBC: 8.5 10*3/uL (ref 4.0–10.5)

## 2013-10-21 LAB — PROTEIN S ACTIVITY: Protein S Activity: 150 % — ABNORMAL HIGH (ref 69–129)

## 2013-10-21 LAB — CARDIOLIPIN ANTIBODIES, IGG, IGM, IGA
ANTICARDIOLIPIN IGM: 0 [MPL'U]/mL — AB (ref <11)
Anticardiolipin IgA: 7 APL U/mL — ABNORMAL LOW (ref <22)
Anticardiolipin IgG: 4 GPL U/mL — ABNORMAL LOW (ref <23)

## 2013-10-21 LAB — HEPARIN LEVEL (UNFRACTIONATED)
HEPARIN UNFRACTIONATED: 0.7 [IU]/mL (ref 0.30–0.70)
Heparin Unfractionated: 0.71 IU/mL — ABNORMAL HIGH (ref 0.30–0.70)
Heparin Unfractionated: 0.65 IU/mL (ref 0.30–0.70)

## 2013-10-21 LAB — PROTEIN C ACTIVITY: Protein C Activity: 200 % — ABNORMAL HIGH (ref 75–133)

## 2013-10-21 LAB — BETA-2-GLYCOPROTEIN I ABS, IGG/M/A
Beta-2 Glyco I IgG: 0 G Units (ref <20)
Beta-2-Glycoprotein I IgA: 19 A Units (ref <20)
Beta-2-Glycoprotein I IgM: 0 M Units (ref <20)

## 2013-10-21 LAB — LACTATE DEHYDROGENASE: LDH: 149 U/L (ref 94–250)

## 2013-10-21 NOTE — Progress Notes (Signed)
[facility] Progress Note     Subjective: Pt very concerned about testicular cancer.  He thinks one of his testicles is much larger than the other one.  He would like the surgeon to evaluate him.  Minimal nausea.  Pain much improved on orals only.  Tolerating regular diet.    Objective: Vital signs in last 24 hours: Temp:  [97.6 F (36.4 C)-98.1 F (36.7 C)] 97.9 F (36.6 C) 11/19/22 0601) Pulse Rate:  [79-83] 81 11/19/2022 0601) Resp:  [16-20] 20 11/19/22 0601) BP: (134-153)/(74-84) 153/84 mmHg 11/19/22 0601) SpO2:  [95 %-99 %] 99 % 11/19/22 0601) Last BM Date: 10/17/13  Intake/Output from previous day: 08/03 0701 - 11-19-22 0700 In: 6074.7 [P.O.:1062; I.V.:5012.7] Out: 2475 [Urine:2475] Intake/Output this shift: Total I/O In: -  Out: 475 [Urine:475]  PE: Gen:  Alert, NAD, pleasant Abd: Soft, mild distension, mild tenderness (improved), +BS, no HSM   Lab Results:   Recent Labs  10/20/13 0440 Nov 18, 2013 0527  WBC 9.5 8.5  HGB 11.9* 12.2*  HCT 35.5* 37.4*  PLT 334 335   BMET No results found for this basename: NA, K, CL, CO2, GLUCOSE, BUN, CREATININE, CALCIUM,  in the last 72 hours PT/INR No results found for this basename: LABPROT, INR,  in the last 72 hours CMP     Component Value Date/Time   NA 138 10/18/2013 0325   K 3.9 10/18/2013 0325   CL 100 10/18/2013 0325   CO2 24 10/18/2013 0325   GLUCOSE 77 10/18/2013 0325   BUN 5* 10/18/2013 0325   CREATININE 0.55 10/18/2013 0325   CALCIUM 9.4 10/18/2013 0325   PROT 7.5 10/18/2013 0325   ALBUMIN 3.6 10/18/2013 0325   AST 28 10/18/2013 0325   ALT 87* 10/18/2013 0325   ALKPHOS 85 10/18/2013 0325   BILITOT 0.5 10/18/2013 0325   GFRNONAA >90 10/18/2013 0325   GFRAA >90 10/18/2013 0325   Lipase     Component Value Date/Time   LIPASE 34 10/17/2013 0159       Studies/Results: Dg Abd 2 Views  18-Nov-2013   CLINICAL DATA:  Abdominal pain  EXAM: ABDOMEN - 2 VIEW  COMPARISON:  10/17/2013  FINDINGS: Scattered large and small bowel gas is  noted. A few mildly prominent small bowel loops are seen which may be related to mild ileus. No obstructive changes are noted. No free air is seen. Fecal material is noted throughout the colon.  IMPRESSION: Changes suggestive of a mild small bowel ileus. No significant obstruction is noted.   Electronically Signed   By: Inez Catalina M.D.   On: 18-Nov-2013 08:09    Anti-infectives: Anti-infectives   None       Assessment/Plan Severe central abdominal pain  Nausea/vomiting  Chronic IVC or high grade stenosis  Acute/subacute thrombosis of SMV  Diffuse mesenteric edema  Prominent mesenteric lymph nodes  Obesity - BMI 41  Marijuana use  Alcohol use  H/o polysubstance abuse   Plan:  1. Tolerating diet, oral pain meds 2. Appreciate Dr. Dicie Beam recommendations and workup, continue heparin for 4-5 days then switch to Xarelto for at least 6 mo at discharge (15mg  BID at discharge) 3. Try 4-5 smaller meals a day to help minimize pain, low protein diet  4. Awaiting many lab results, home on Wednesday per Dr. Marin Olp 5. May need lymph node biopsy at some point     LOS: 4 days    DORT, [employer] 11/18/13, 10:15 AM Pager: [phone]

## 2013-10-21 NOTE — Progress Notes (Signed)
ANTICOAGULATION CONSULT NOTE - Follow-up Consult  Pharmacy Consult:  Heparin Indication:  Intra-abdominal clot  No Known Allergies  Patient Measurements: Height: 5\' 9"  (175.3 cm) Weight: 280 lb (127.007 kg) IBW/kg (Calculated) : 70.7 Heparin Dosing Weight: 100 kg  Vital Signs: Temp: 97.5 F (36.4 C) (08/04 1330) Temp src: Oral (08/04 1330) BP: 146/78 mmHg (08/04 1330) Pulse Rate: 84 (08/04 1330)  Labs:  Recent Labs  10/19/13 0258 10/20/13 0440  10/21/13 0527 10/21/13 1205 10/21/13 1939  HGB 13.5 11.9*  --  12.2*  --   --   HCT 40.1 35.5*  --  37.4*  --   --   PLT 358 334  --  335  --   --   HEPARINUNFRC 0.37 0.12*  < > 0.70 0.71* 0.65  < > = values in this interval not displayed.  Estimated Creatinine Clearance: 190.9 ml/min (by C-G formula based on Cr of 0.55).  Assessment: 79 YOM with history of appendectomy 2 weeks ago presented with abdominal pain, nausea and vomiting. Pt on heparin for SMV clot- hematology workup is underway right now. Plan is to continue heparin for another day and then transition to [place].  Heparin level therapeutic at 0.65 after rate decreased to 3500 units/hr. CBC is stable, no bleeding noted.  Goal of Therapy:  Heparin level 0.3-0.7 units/ml Monitor platelets by anticoagulation protocol: Yes   Plan:  continue heparin rate at 3500 units/hr  Daily HL and CBC  Follow for transition to Xarelto/long term anticoagulation  Eudelia Bunch, Pharm.D. [phone] 10/21/2013 8:27 PM

## 2013-10-21 NOTE — Progress Notes (Signed)
Jesse Underwood   DOB:Aug 25, 1990   ZD#:[id number]   QVZ#:[id number]  Subjective: Afebrile.  Abdominal pain controlled. On regular diet. No nausea r vomiting. No respiratory or cardiac complaints. No bleeding issues. No confusion.   Scheduled Meds: . antiseptic oral rinse  7 mL Mouth Rinse BID  . chlorhexidine  15 mL Mouth Rinse BID  . docusate sodium  100 mg Oral BID   Continuous Infusions: . sodium chloride 100 mL/hr at 10/21/13 0109  . heparin 3,600 Units/hr (10/21/13 0953)   PRN Meds:.diphenhydrAMINE, diphenhydrAMINE, ondansetron, oxyCODONE-acetaminophen   Objective:  Filed Vitals:   10/21/13 0601  BP: 153/84  Pulse: 81  Temp: 97.9 F (36.6 C)  Resp: 20      Intake/Output Summary (Last 24 hours) at 10/21/13 1141 Last data filed at 10/21/13 3295  Gross per 24 hour  Intake 5834.67 ml  Output   2200 ml  Net 3634.67 ml    ECOG PERFORMANCE STATUS:   1 Symptomatic but completely ambulatory (Restricted in physically strenuous activity but ambulatory and able to carry out work of a light or sedentary nature. For example, light housework, office work)     GENERAL:alert, no distress and comfortable SKIN: skin color, texture, turgor are normal, no rashes or significant lesions EYES: normal, conjunctiva are pink and non-injected, sclera clear OROPHARYNX:no exudate, no erythema and lips, buccal mucosa, and tongue normal  NECK: supple, thyroid normal size, non-tender, without nodularity LYMPH:  no palpable lymphadenopathy in the cervical, axillary or inguinal LUNGS: clear to auscultation and percussion with normal breathing effort HEART: regular rate & rhythm and no murmurs and no lower extremity edema ABDOMEN:somewhat distended, mildly tender to palpation on the right quadrant. His bowel sounds are diminished (x ray shows mild SB ileus without obstruction)  Musculoskeletal:no cyanosis of digits and no clubbing  PSYCH: alert & oriented x 3 with fluent speech NEURO: no focal  motor/sensory deficits    CBG (last 3)  No results found for this basename: GLUCAP,  in the last 72 hours   Labs:   Recent Labs Lab 10/17/13 0159 10/18/13 0325 10/19/13 0020 10/19/13 0258 10/20/13 0440 10/21/13 0527  WBC 12.2* 13.0* 12.0* 11.5* 9.5 8.5  HGB 15.1 14.5 13.5 13.5 11.9* 12.2*  HCT 44.6 42.9 40.5 40.1 35.5* 37.4*  PLT 460* 412* 331 358 334 335  MCV 90.8 90.3 91.0 92.0 90.6 90.8  MCH 30.8 30.5 30.3 31.0 30.4 29.6  MCHC 33.9 33.8 33.3 33.7 33.5 32.6  RDW 12.6 12.5 12.6 12.5 12.4 12.5  LYMPHSABS 3.1  --   --   --   --   --   MONOABS 1.4*  --   --   --   --   --   EOSABS 0.3  --   --   --   --   --   BASOSABS 0.1  --   --   --   --   --      Chemistries:    Recent Labs Lab 10/17/13 0159 10/18/13 0325  NA 139 138  K 4.2 3.9  CL 98 100  CO2 23 24  GLUCOSE 83 77  BUN 9 5*  CREATININE 0.61 0.55  CALCIUM 9.5 9.4  AST 33 28  ALT 110* 87*  ALKPHOS 92 85  BILITOT 0.4 0.5    GFR Estimated Creatinine Clearance: 190.9 ml/min (by C-G formula based on Cr of 0.55).  Liver Function Tests:  Recent Labs Lab 10/17/13 0159 10/18/13 0325  AST 33 28  ALT  110* 87*  ALKPHOS 92 85  BILITOT 0.4 0.5  PROT 7.7 7.5  ALBUMIN 3.9 3.6    Recent Labs Lab 10/17/13 0159  LIPASE 34   No results found for this basename: AMMONIA,  in the last 168 hours  Urine Studies     Component Value Date/Time   COLORURINE YELLOW 10/17/2013 [place] 10/17/2013 0455   LABSPEC 1.020 10/17/2013 0455   PHURINE 5.5 10/17/2013 [place] 10/17/2013 0455   HGBUR NEGATIVE 10/17/2013 0455   HGBUR trace-intact 09/13/2009 1416   BILIRUBINUR NEGATIVE 10/17/2013 0455   BILIRUBINUR n 09/24/2013 1514   KETONESUR 15* 10/17/2013 0455   PROTEINUR NEGATIVE 10/17/2013 0455   PROTEINUR 1+ 09/24/2013 1514   UROBILINOGEN 0.2 10/17/2013 0455   UROBILINOGEN 0.2 09/24/2013 1514   NITRITE NEGATIVE 10/17/2013 0455   NITRITE n 09/24/2013 1514   LEUKOCYTESUR NEGATIVE 10/17/2013 0455        Imaging Studies:  Dg Abd 2 Views  10/21/2013   CLINICAL DATA:  Abdominal pain  EXAM: ABDOMEN - 2 VIEW  COMPARISON:  10/17/2013  FINDINGS: Scattered large and small bowel gas is noted. A few mildly prominent small bowel loops are seen which may be related to mild ileus. No obstructive changes are noted. No free air is seen. Fecal material is noted throughout the colon.  IMPRESSION: Changes suggestive of a mild small bowel ileus. No significant obstruction is noted.   Electronically Signed   By: Inez Catalina M.D.   On: 10/21/2013 08:09    Assessment/Plan: 23 y.o. 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 Rest of panel pending on IV heparin per Pharmacy for now, then get him onto Xarelto at 15 mg twice a day for at least 6 months after discharge.If panel is positive, he will need anticoagulation for 1 year. Will follow.  3.Abdominal Pain Secondary to #1 and #2 Controlled with current regimen and liquid diet. Advance as per Surgery team  4.Full Code  5. Disposition Possibly home within the next 24-48 hrs   Other medical issues as per admitting team     **Disclaimer: This note was dictated with voice recognition software. Similar sounding words can inadvertently be transcribed and this note may contain transcription errors which may not have been corrected upon publication of note.** WERTMAN,SARA E, PA-C 10/21/2013  11:41 AM  ADDENDUM:    I AGREE WITH THE ABOVE. I SPOKE WITH RADIOLOGY AND THEY DO NOT SEE A ROLE FOR STENTING OF IVC. 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  SO FAR THE THROMBOPHILIC STUDIES ARE NEGATIVE.  HE MAY BE DEVELOPING AN ILEUS, THIS WILL HAVE TO BE WATCHED CLOSELY. HE IS TAKING IN REGULAR  FOOD.  I THINK HE COULD BE SWITCHED TO ORAL XARELTO IN THE AM.  HIS HEPARIN LEVEL IS MUCH BETTER.  PHARMACY HAS DONE A GREAT JOB WITH THIS.   PETE E

## 2013-10-21 NOTE — Progress Notes (Signed)
See PA note. Pt concerned for testicular ca.  Some nausea but no vomiting  Alert, nad, nontoxic Obese, soft, nt Scrotum - rt scrotal mass - c/w hematoma or hydrocele - tender, difficult exam due to TTP of well formed mass most c/w hematoma or hydrocele. L testicle - no appreciable masses  Reviewed ct - no evid of groin hernia on CT therefore believe this is most c/w hydrocele or hematoma   rec to pt that swelling should go down with time. If persists may need scrotal u/s. Don't see need for it now - doubt he could tolerate it  Leighton Ruff. Redmond Pulling, MD, FACS General, Bariatric, & Minimally Invasive Surgery [facility], [place]

## 2013-10-21 NOTE — Progress Notes (Signed)
ANTICOAGULATION CONSULT NOTE - Follow-up Consult  Pharmacy Consult:  Heparin Indication:  Intra-abdominal clot  No Known Allergies  Patient Measurements: Height: 5\' 9"  (175.3 cm) Weight: 280 lb (127.007 kg) IBW/kg (Calculated) : 70.7 Heparin Dosing Weight: 100 kg  Vital Signs: Temp: 97.9 F (36.6 C) (08/04 0601) Temp src: Oral (08/04 0601) BP: 153/84 mmHg (08/04 0601) Pulse Rate: 81 (08/04 0601)  Labs:  Recent Labs  10/19/13 0258 10/20/13 0440  10/20/13 1930 10/21/13 0527 10/21/13 1205  HGB 13.5 11.9*  --   --  12.2*  --   HCT 40.1 35.5*  --   --  37.4*  --   PLT 358 334  --   --  335  --   HEPARINUNFRC 0.37 0.12*  < > 0.20* 0.70 0.71*  < > = values in this interval not displayed.  Estimated Creatinine Clearance: 190.9 ml/min (by C-G formula based on Cr of 0.55).  Assessment: 61 YOM with history of appendectomy 2 weeks ago presented with abdominal pain, nausea and vomiting. Pt on heparin for SMV clot- hematology workup is underway right now. Plan is to continue heparin for another day and then transition to [place].  Heparin level dropped after being therapeutic and rate was increased last evening to 3600 units/hr. A level drawn after that increase was at upper end of range- no changes made and a level was rechecked this morning. That level remained at upper end of range. CBC is stable, no bleeding noted.  Goal of Therapy:  Heparin level 0.3-0.7 units/ml Monitor platelets by anticoagulation protocol: Yes   Plan:  1. Decrease heparin rate slightly to 3500 units/hr 2. Heparin level in 6 hours 3. Daily HL and CBC 4. Follow for transition to Xarelto/long term anticoagulation  Mar Zettler D. Vaughn Frieze, PharmD, BCPS Clinical Pharmacist Pager: [phone] 10/21/2013 12:46 PM

## 2013-10-21 NOTE — Progress Notes (Signed)
ANTICOAGULATION CONSULT NOTE - Follow Up Consult  Pharmacy Consult for heparin Indication: intraabdominal clot  Labs:  Recent Labs  10/19/13 0258 10/20/13 0440 10/20/13 1145 10/20/13 1930 10/21/13 0527  HGB 13.5 11.9*  --   --  12.2*  HCT 40.1 35.5*  --   --  37.4*  PLT 358 334  --   --  335  HEPARINUNFRC 0.37 0.12* 0.15* 0.20* 0.70    Assessment/Plan:  23yo male now therapeutic on heparin after multiple rate increases. Will continue gtt at current rate and confirm stable with additional level.   Wynona Neat, PharmD, BCPS  10/21/2013,6:25 AM

## 2013-10-22 ENCOUNTER — Inpatient Hospital Stay (HOSPITAL_COMMUNITY): Payer: BC Managed Care – PPO

## 2013-10-22 DIAGNOSIS — F1010 Alcohol abuse, uncomplicated: Secondary | ICD-10-CM | POA: Diagnosis present

## 2013-10-22 DIAGNOSIS — R109 Unspecified abdominal pain: Secondary | ICD-10-CM | POA: Diagnosis present

## 2013-10-22 DIAGNOSIS — IMO0002 Reserved for concepts with insufficient information to code with codable children: Secondary | ICD-10-CM

## 2013-10-22 DIAGNOSIS — R112 Nausea with vomiting, unspecified: Secondary | ICD-10-CM | POA: Diagnosis present

## 2013-10-22 DIAGNOSIS — F1210 Cannabis abuse, uncomplicated: Secondary | ICD-10-CM | POA: Diagnosis present

## 2013-10-22 DIAGNOSIS — K55069 Acute infarction of intestine, part and extent unspecified: Secondary | ICD-10-CM | POA: Diagnosis present

## 2013-10-22 DIAGNOSIS — R590 Localized enlarged lymph nodes: Secondary | ICD-10-CM | POA: Diagnosis present

## 2013-10-22 LAB — LUPUS ANTICOAGULANT PANEL
DRVVT: 35.7 s (ref <42.9)
Lupus Anticoagulant: NOT DETECTED
PTT LA: 37.3 s (ref 28.0–43.0)

## 2013-10-22 LAB — CBC
HCT: 35.5 % — ABNORMAL LOW (ref 39.0–52.0)
Hemoglobin: 11.9 g/dL — ABNORMAL LOW (ref 13.0–17.0)
MCH: 30.4 pg (ref 26.0–34.0)
MCHC: 33.5 g/dL (ref 30.0–36.0)
MCV: 90.8 fL (ref 78.0–100.0)
Platelets: 356 10*3/uL (ref 150–400)
RBC: 3.91 MIL/uL — ABNORMAL LOW (ref 4.22–5.81)
RDW: 12.7 % (ref 11.5–15.5)
WBC: 7 10*3/uL (ref 4.0–10.5)

## 2013-10-22 LAB — LACTATE DEHYDROGENASE: LDH: 161 U/L (ref 94–250)

## 2013-10-22 LAB — BETA HCG QUANT (REF LAB): Beta hCG, Tumor Marker: <2 m[IU]/mL (ref <5.0)

## 2013-10-22 LAB — JAK2 GENOTYPR: JAK2 GenotypR: NOT DETECTED

## 2013-10-22 LAB — HEPARIN LEVEL (UNFRACTIONATED): HEPARIN UNFRACTIONATED: 0.58 [IU]/mL (ref 0.30–0.70)

## 2013-10-22 MED ORDER — OXYCODONE-ACETAMINOPHEN 5-325 MG PO TABS: 1.0000 | ORAL_TABLET | Freq: Four times a day (QID) | ORAL | Status: DC | PRN

## 2013-10-22 MED ORDER — APIXABAN 5 MG PO TABS: 5.0000 mg | ORAL_TABLET | Freq: Two times a day (BID) | ORAL | Status: DC

## 2013-10-22 MED ORDER — APIXABAN 5 MG PO TABS
5.0000 mg | ORAL_TABLET | Freq: Two times a day (BID) | ORAL | Status: DC
  Filled 2013-10-22: qty 1

## 2013-10-22 MED ORDER — DSS 100 MG PO CAPS: 100.0000 mg | ORAL_CAPSULE | Freq: Two times a day (BID) | ORAL | Status: DC | PRN

## 2013-10-22 MED ORDER — RIVAROXABAN 15 MG PO TABS
15.0000 mg | ORAL_TABLET | Freq: Two times a day (BID) | ORAL | Status: DC
  Administered 2013-10-22: 15 mg via ORAL
  Filled 2013-10-22 (×3): qty 1

## 2013-10-22 NOTE — Progress Notes (Signed)
Mr.Repinski is improving slowly but surely. He's eating regular food. He is out of bed. He is having bowel movements.  His abdominal pain is improving. He is on oral pain medication for this.  He's had no leg swelling. Present no bleeding.  I think we can convert him over to a Xarelto now. He's been on heparin for 5 days. I think this has been a good intervention for him. This has been effective in any out the mesenteric vein thrombus.  Am not sure about the lymphadenopathy in the abdomen. In talking to one of the radiologists, he thinks that some of the lymph nodes could be enlarged collateral blood vessels. He recommended a followup CT scan with contrast in about 2 months or so.  So far, his hypercoagulable studies are normal.  He was complaining of some and testicular enlargement. We will get an ultrasound of his scrotum to make sure there is no occult testicular cancer. His tumor markers are normal so far. I do not have back the beta-hCG.  On his physical exam, his vital signs stable. Blood pressure 155/75. Temperature 97.5. Pulse 88. Lungs are clear. Cardiac exam regular in rhythm with no murmurs rubs or bruits. Abdomen is soft. Bowel sounds are better. Abdomen is not as distended. Extremities shows no clubbing cyanosis or edema.  I think he probably can go home today. I would get a scrotal ultrasound.  I wrote the order for his Xarelto. He will be on 15 mg twice a day for 3 weeks and then go up to 20 mg a day. I would have him take Xarelto for a good 6 months.  I will see him as an outpatient in 2-3 weeks.  Lum Keas  Job 2:10

## 2013-10-22 NOTE — Progress Notes (Signed)
Plan discussed with PA  Leighton Ruff. Redmond Pulling, MD, FACS General, Bariatric, & Minimally Invasive Surgery [facility], [place]

## 2013-10-22 NOTE — Discharge Instructions (Addendum)
Apixaban oral tablets What is this medicine? APIXABAN (a PIX a ban) is an anticoagulant (blood thinner). It is used to lower the chance of stroke in people with a medical condition called atrial fibrillation. It is also used to treat or prevent blood clots in the lungs or in the veins. This medicine may be used for other purposes; ask your health care provider or pharmacist if you have questions. COMMON BRAND NAME(S): Eliquis What should I tell my health care provider before I take this medicine? They need to know if you have any of these conditions: -bleeding disorders -bleeding in the brain -blood in your stools (black or tarry stools) or if you have blood in your vomit -history of stomach bleeding -kidney disease -liver disease -mechanical heart valve -an unusual or allergic reaction to apixaban, other medicines, foods, dyes, or preservatives -pregnant or trying to get pregnant -breast-feeding How should I use this medicine? Take this medicine by mouth with a glass of water. Follow the directions on the prescription label. You can take it with or without food. If it upsets your stomach, take it with food. Take your medicine at regular intervals. Do not take it more often than directed. Do not stop taking except on your doctor's advice. Stopping this medicine may increase your risk of a blot clot. Be sure to refill your prescription before you run out of medicine. Talk to your pediatrician regarding the use of this medicine in children. Special care may be needed. Overdosage: If you think you have taken too much of this medicine contact a poison control center or emergency room at once. NOTE: This medicine is only for you. Do not share this medicine with others. What if I miss a dose? If you miss a dose, take it as soon as you can. If it is almost time for your next dose, take only that dose. Do not take double or extra doses. What may interact with this medicine? This medicine may  interact with the following: -aspirin and aspirin-like medicines -certain medicines for fungal infections like ketoconazole and itraconazole -certain medicines for seizures like carbamazepine and phenytoin -certain medicines that treat or prevent blood clots like warfarin, enoxaparin, and dalteparin -clarithromycin -NSAIDs, medicines for pain and inflammation, like ibuprofen or naproxen -rifampin -ritonavir -St. John's wort This list may not describe all possible interactions. Give your health care provider a list of all the medicines, herbs, non-prescription drugs, or dietary supplements you use. Also tell them if you smoke, drink alcohol, or use illegal drugs. Some items may interact with your medicine. What should I watch for while using this medicine? Notify your doctor or health care professional and seek emergency treatment if you develop breathing problems; changes in vision; chest pain; severe, sudden headache; pain, swelling, warmth in the leg; trouble speaking; sudden numbness or weakness of the face, arm, or leg. These can be signs that your condition has gotten worse. If you are going to have surgery, tell your doctor or health care professional that you are taking this medicine. Tell your health care professional that you use this medicine before you have a spinal or epidural procedure. Sometimes people who take this medicine have bleeding problems around the spine when they have a spinal or epidural procedure. This bleeding is very rare. If you have a spinal or epidural procedure while on this medicine, call your health care professional immediately if you have back pain, numbness or tingling (especially in your legs and feet), muscle weakness, paralysis, or loss  of bladder or bowel control. Avoid sports and activities that might cause injury while you are using this medicine. Severe falls or injuries can cause unseen bleeding. Be careful when using sharp tools or knives. Consider using  an Copy. Take special care brushing or flossing your teeth. Report any injuries, bruising, or red spots on the skin to your doctor or health care professional. What side effects may I notice from receiving this medicine? Side effects that you should report to your doctor or health care professional as soon as possible: -allergic reactions like skin rash, itching or hives, swelling of the face, lips, or tongue -signs and symptoms of bleeding such as bloody or black, tarry stools; red or dark-brown urine; spitting up blood or brown material that looks like coffee grounds; red spots on the skin; unusual bruising or bleeding from the eye, gums, or nose This list may not describe all possible side effects. Call your doctor for medical advice about side effects. You may report side effects to FDA at 1-800-FDA-1088. Where should I keep my medicine? Keep out of the reach of children. Store at room temperature between 20 and 25 degrees C (68 and 77 degrees F). Throw away any unused medicine after the expiration date. NOTE: This sheet is a summary. It may not cover all possible information. If you have questions about this medicine, talk to your doctor, pharmacist, or health care provider.  2015, Elsevier/Gold Standard. (2012-11-08 11:59:24)     Information on my medicine - ELIQUIS (apixaban)  This medication education was reviewed with me or my healthcare representative as part of my discharge preparation.  The pharmacist that spoke with me during my hospital stay was:  Eliana Lueth, RPH  Why was Eliquis prescribed for you? Eliquis was prescribed for you to reduce the risk of forming blood clots that can cause a stroke if you have a medical condition called atrial fibrillation (a type of irregular heartbeat) OR to reduce the risk of a blood clots forming after orthopedic surgery.  What do You need to know about Eliquis ? Take your Eliquis TWICE DAILY - one tablet in the morning and  one tablet in the evening with or without food.  It would be best to take the doses about the same time each day.  If you have difficulty swallowing the tablet whole please discuss with your pharmacist how to take the medication safely.  Take Eliquis exactly as prescribed by your doctor and DO NOT stop taking Eliquis without talking to the doctor who prescribed the medication.  Stopping may increase your risk of developing a new clot or stroke.  Refill your prescription before you run out.  After discharge, you should have regular check-up appointments with your healthcare provider that is prescribing your Eliquis.  In the future your dose may need to be changed if your kidney function or weight changes by a significant amount or as you get older.  What do you do if you miss a dose? If you miss a dose, take it as soon as you remember on the same day and resume taking twice daily.  Do not take more than one dose of ELIQUIS at the same time.  Important Safety Information A possible side effect of Eliquis is bleeding. You should call your healthcare provider right away if you experience any of the following:   Bleeding from an injury or your nose that does not stop.   Unusual colored urine (red or dark brown) or unusual colored  stools (red or black).   Unusual bruising for unknown reasons.   A serious fall or if you hit your head (even if there is no bleeding).  Some medicines may interact with Eliquis and might increase your risk of bleeding or clotting while on Eliquis. To help avoid this, consult your healthcare provider or pharmacist prior to using any new prescription or non-prescription medications, including herbals, vitamins, non-steroidal anti-inflammatory drugs (NSAIDs) and supplements.  This website has more information on Eliquis (apixaban): [email or web address].

## 2013-10-22 NOTE — Progress Notes (Signed)
Subjective: Patient still complaining of mild abdominal pain, but much improved.  Currently only on oral pain medication.  Reports normal BM and passing flatus.  Tolerating regular diet well.  No nausea or vomiting.  VSS.  Abfebrile.  Scrotal u/s revealed relative enlargement of right epididymis compared to left, likely WNL but may reflect some degree of epididymitis.  Also showed small right hydrocele.  Morning labs revealed WBC 7.0, LDH 161 and Heparin level 0.58.   Objective: Vital signs in last 24 hours: Temp:  [97.5 F (36.4 C)-98 F (36.7 C)] 97.5 F (36.4 C) (08/05 0601) Pulse Rate:  [78-88] 88 (08/05 0601) Resp:  [16-18] 17 (08/05 0601) BP: (145-155)/(75-86) 155/75 mmHg (08/05 0601) SpO2:  [95 %-97 %] 95 % (08/05 0601) Last BM Date: 10/17/13  Intake/Output from previous day: 08/04 0701 - 08/05 0700 In: 2385 [P.O.:240; I.V.:2145] Out: 9485 [Urine:3175]  General Appearance: WDWN white male, laying in bed, appears sleepy, NAD  Heart: RRR with no obvious gallops, rubs, murmurs or clicks. Radial and distal pulses 2+ and symmetric  Lungs: CTAB with no wheezes, rhonchi or rales. Normal effort, nonlabored.  Abdomen: Soft, mild distention, mild tenderness to epigastrum, +BS. No masses, hernias or organomegaly. No peritoneal signs. Incision port sites are well healed.  Skin: warm, dry and intact with no masses, lesions, ulcers or rashes.  Extremities: No cyanosis, clubbing or edema.  Genital: Right testicle enlarged. No tenderness to palpation of testicle or epididymitis bilaterally.  Neuro: A&Ox3     Lab Results:   Recent Labs  10/21/13 0527 10/22/13 0414  WBC 8.5 7.0  HGB 12.2* 11.9*  HCT 37.4* 35.5*  PLT 335 356    Studies/Results: US Scrotum  10/22/2013   CLINICAL DATA:  Retroperitoneal adenopathy.  EXAM: ULTRASOUND OF SCROTUM  TECHNIQUE: Complete ultrasound examination of the testicles, epididymis, and other scrotal structures was performed.  COMPARISON:  None.   FINDINGS: Right testicle  Measurements: 5.5 x 4.2 x 3.9 cm. No mass or microlithiasis visualized.  Left testicle  Measurements: 5.8 x 3.3 x 3.3 cm. No mass or microlithiasis visualized.  Right epididymis: The right epididymis is enlarged relative to the left but shows no obvious focal mass lesion.  Left epididymis: Normal in size and appearance. 0.6 cm left epididymal cyst appears benign.  Hydrocele:  There is a small right hydrocele.  Varicocele:  None visualized.  IMPRESSION: No evidence of testicular mass. Relative enlargement of the right epididymis compared to the left is likely within normal limits but may reflect some degree of epididymitis if the patient is focally tender.   Electronically Signed   By: Aletta Edouard M.D.   On: 10/22/2013 08:12   Dg Abd 2 Views  10/21/2013   CLINICAL DATA:  Abdominal pain  EXAM: ABDOMEN - 2 VIEW  COMPARISON:  10/17/2013  FINDINGS: Scattered large and small bowel gas is noted. A few mildly prominent small bowel loops are seen which may be related to mild ileus. No obstructive changes are noted. No free air is seen. Fecal material is noted throughout the colon.  IMPRESSION: Changes suggestive of a mild small bowel ileus. No significant obstruction is noted.   Electronically Signed   By: Inez Catalina M.D.   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 Abdominal pain is much improved on po pain medication.  Encourage 4-5 smaller meals per day and low protein diet to continue to minimize pain.  2. Hypercoagulable studies are normal thus far.  Start patient on Xarelto 15 mg BID, but we were unable to get a coupon for Xarelto thus I talked to Dr. Marin Olp and  he agreed to let him switch to Eliquis 5mg  BID.  Dr. Marin Olp to f/u with patient as an outpatient in 2-3 weeks for further work up.  3. Tumor markers normal thus far.  May need lymph node biopsy in future.  4. Scrotal u/s revealed relative enlargement of right epididymitis compared to left, likely WNL but may reflect some degree of epididymitis.   Also showed small right hydrocele.  Less likely epididymitis as there was no focal tenderness on exam.  Right hydrocele likely to resolve without intervention.  5. Tolerating diet.  Pain controlled on po medication.  Normal BM.  VSS.  Afebrile.  WBC 7.0, LDH 161.  Stable to d/c to home.    LOS: 5 days    Molli Knock, PA-S 10/22/2013  ----------------------------------------------------------------------------------------------------------- General Surgery PA Preceptor Note:  I agree with the above PA students findings as above.  Switched to eliquis so that he can have a smaller co-pay $10/mo instead of the large normal copay.  Encouraged healthy lifestyle, avoiding alcohol and any recreational drugs or smoking.  Instructed them to make an appt with Dr. Marin Olp and also have his brother evaluated as well.     [name], PA-C General Surgery [facility] Pager: [phone] Office: [phone]  \

## 2013-10-22 NOTE — Progress Notes (Signed)
Discussed discharge summary with patient. Reviewed all medications with patient. Patient received Rx. Patient did not have any questions. Patient ready for discharge. 

## 2013-10-22 NOTE — Discharge Summary (Signed)
Jesse Underwood M. Jesse Jou, MD, FACS General, Bariatric, & Minimally Invasive [facility]  

## 2013-10-22 NOTE — Discharge Summary (Signed)
[facility] Discharge Summary   Patient ID: Jesse Underwood MRN: [id number] DOB/AGE: 23-Apr-1990 23 y.o.  Admit date: 10/17/2013 Discharge date: 10/22/2013  Admitting Diagnosis: Severe central abdominal pain  Nausea/vomiting  Chronic IVC or high grade stenosis  Acute/subacute thrombosis of SMV  Diffuse mesenteric edema  Prominent mesenteric lymph nodes  Obesity - BMI 41  Marijuana use  Alcohol use  H/o polysubstance abuse   Discharge Diagnosis Patient Active Problem List   Diagnosis Date Noted  . Sudden onset of severe abdominal pain 10/22/2013  . Nausea with vomiting 10/22/2013  . 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 Mesenteric lymphadenopathy 10/22/2013  . Severe obesity (BMI >= 40) 10/22/2013  . Marijuana abuse 10/22/2013  . Alcohol abuse 10/22/2013  . 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 Appendicitis, acute 10/03/2013  . Abdominal pain 10/01/2013  . VIRAL URI 04/23/2009  . ACUTE SINUSITIS, UNSPECIFIED 12/21/2008  . ACUTE BRONCHITIS 12/16/2008  . OBESITY 08/03/2008    Consultants Dr. Burney Gauze, Oncology   Imaging: US Scrotum  10/22/2013   CLINICAL DATA:  Retroperitoneal adenopathy.  EXAM: ULTRASOUND OF SCROTUM  TECHNIQUE: Complete ultrasound examination of the testicles, epididymis, and other scrotal structures was performed.  COMPARISON:  None.  FINDINGS: Right testicle  Measurements: 5.5 x 4.2 x 3.9 cm. No mass or microlithiasis visualized.  Left testicle  Measurements: 5.8 x 3.3 x 3.3 cm. No mass or microlithiasis visualized.  Right epididymis: The right epididymis is enlarged relative to the left but shows no obvious focal mass lesion.  Left epididymis: Normal in size and appearance. 0.6 cm left epididymal cyst appears benign.  Hydrocele:  There is a small right hydrocele.  Varicocele:  None visualized.  IMPRESSION: No evidence of testicular mass. Relative enlargement of the right epididymis  compared to the left is likely within normal limits but may reflect some degree of epididymitis if the patient is focally tender.   Electronically Signed   By: Aletta Edouard M.D.   On: 10/22/2013 08:12   Dg Abd 2 Views  10/21/2013   CLINICAL DATA:  Abdominal pain  EXAM: ABDOMEN - 2 VIEW  COMPARISON:  10/17/2013  FINDINGS: Scattered large and small bowel gas is noted. A few mildly prominent small bowel loops are seen which may be related to mild ileus. No obstructive changes are noted. No free air is seen. Fecal material is noted throughout the colon.  IMPRESSION: Changes suggestive of a mild small bowel ileus. No significant obstruction is noted.   Electronically Signed   By: Inez Catalina M.D.   On: 10/21/2013 08:09     Hospital Course:   23 y/o white male with no significant PMH presents to [facility] with 3 days (10/15/13) of worsening abdominal pain, nausea, vommiting, constipation. He recently had a laparoscopic appendectomy for appendicitis he was discharged with 7 days of PO antibiotics which he promptly finished. He notes he has normal post-surgical pain which resolved and he was feeling great until 9 days ago. Admits to trouble sleeping for the last week. He denies fever, chills, diarrhea, fatigue, musculoskeletal pain or loss of ROM, weakness, dizziness, CP/SOB, or confusion. No radicular symptoms or alleviating/precipitating factors. The patient uses marijuana daily 1-2 blunts and has used psychedelics (LSD etc.) drugs in the past. He just finished college this spring and is not currently working. No developmental, cogitative, or physical delays per Dad. Dad notes no blood disorders, problems with vessels/arteries, no aneurysms or strokes. No family h/o requiring blood thinners.  No family h/o abdominal surgeries except mother who had a colon obstruction and resection. Only relevant family history is mother, maternal grandfather and maternal uncle had CAD/HTN and had heart bypasses.   CT ab/pelvis with  "stenosis of the inferior vena cava with multiple bilateral gonadal vein and mesenteric vein varices as well as subcutaneous soft tissue varices. Enlargement of the superior mesenteric vein. Venous thrombosis should be excluded." MRI shows:   "1. 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 There is extensive acute/subacute thrombosis of the superior mesenteric vein and collateral vessels draining into it. The cause of the IVC occlusion is inapparent, potentially congenital. 2. Diffuse mesenteric edema is present associated with this venous thrombosis. No focal extraluminal fluid collections identified. 3. In addition to the dilated vessels, there are few mildly prominent mesenteric lymph nodes which are probably reactive. 4. No evidence of bowel obstruction or bowel wall thickening."   Dr. Oneida Alar from vascular surgery was consulted and recommended starting heparin and consulting general surgery.   Patient admitted by [facility].  He was made NPO for bowel rest and started on IV fluids, pain medication, antiemetrics and heparin drip recommended by Dr. Oneida Alar.  Dr. Marin Olp, Oncology, was consulted for further workup of hypercoagulable state and recommended doppler of legs, continuation of heparin drip, xarelto once heparin levels were therapeutic (4-5 days)  and aggressive pain control.  Bilateral lower extremity venous dopplers revealed no DVT or SVT in bilateral lower extremities.  Patient became concerned about possibility of testicular cancer due to right testicular enlargement and tenderness.  Scrotal u/s was ordered revealing no evidence of testicular mass, relative enlargement of epidiymis compared to left likely within normal limits but may reflect epididymitis if focally tender, small right hydrocele present.  On exam, right testicle was non-tender which was inconsistent with epididymitis. Patient reassured hydrocele would most  likely spontaneously resolve.  Diet was slowly advanced and abdominal pain slowly improved over course of stay.  Heparin maintained and monitored for therapeutic level over course of stay.  Hypercoaguable studies and tumor markers all normal thus far.  Patient was concerned about cost of xarelto so discussed prescribing Eliquis instead, Dr. Marin Olp agreeable to change since we were able to give him a medication coupon.   We will maintain him on Eliquis 5 mg BID at d/c.  On  HD5, the patient was voiding well, tolerating diet, ambulating well, pain well controlled, vital signs stable, and felt stable for discharge home with his father.  Patient to follow up with Dr. Marin Olp in 2-3 weeks and knows to call his office with questions or concerns.  He should also follow up with PCP with post hospital f/u.    Physical Exam: General Appearance: WDWN white male, laying in bed, appears sleepy, NAD   Heart: RRR with no obvious gallops, rubs, murmurs or clicks. Radial and distal pulses 2+ and symmetric  Lungs: CTAB with no wheezes, rhonchi or rales. Normal effort, nonlabored.  Abdomen: Soft, mild distention, mild tenderness to epigastrum, +BS. No masses, hernias or organomegaly. No peritoneal signs. Incision port sites are well healed.  Skin: warm, dry and intact with no masses, lesions, ulcers or rashes.  Extremities: No cyanosis, clubbing or edema.  Genital: Right testicle enlarged, well differentiated mass noted.  Left testicle normal appearing, no appreciable mass.  No tenderness to palpation of testicle bilaterally.  Neuro: A&Ox3        Medication List    STOP taking these medications  amoxicillin-clavulanate 875-125 MG per tablet  Commonly known as:  AUGMENTIN     ibuprofen 200 MG tablet  Commonly known as:  ADVIL,MOTRIN     simethicone 80 MG chewable tablet  Commonly known as:  MYLICON      TAKE these medications       acetaminophen 325 MG tablet  Commonly known as:  TYLENOL  Take 2  tablets (650 mg total) by mouth every 6 (six) hours as needed for mild pain (or Temp > 100).     apixaban 5 MG Tabs tablet  Commonly known as:  ELIQUIS  Take 1 tablet (5 mg total) by mouth 2 (two) times daily.     DSS 100 MG Caps  Take 100 mg by mouth 2 (two) times daily as needed for mild constipation or moderate constipation.     oxyCODONE-acetaminophen 5-325 MG per tablet  Commonly known as:  PERCOCET/ROXICET  Take 1-2 tablets by mouth every 6 (six) hours as needed for moderate pain.     senna 8.6 MG Tabs tablet  Commonly known as:  SENOKOT  Take 1 tablet by mouth daily as needed for mild constipation.         Follow-up Information   Follow up with Volanda Napoleon, MD. Schedule an appointment as soon as possible for a visit in 2 weeks. (For post-hospital follow up)    Specialty:  Oncology   Contact information:   [address] [place] [address] [phone]       Follow up with Laurey Morale, MD. (for post-hospital follow up)    Specialty:  Family Medicine   Contact information:   [place]  [address] [phone]       Call [facility]. (As needed - This was the general surgery specialist who took care of you during your hospitalization.)    Contact information:   [place] [address] [phone]      Signed: Hal Morales, PA-S [facility] [phone]  10/22/2013, 11:49 AM  ----------------------------------------------------------------------------------------------------------- General Surgery PA Preceptor Note:  I agree with the above PA students findings as above.    [name], PA-C General Surgery [facility] Pager: [phone] Office: [phone]

## 2013-10-23 ENCOUNTER — Telehealth (INDEPENDENT_AMBULATORY_CARE_PROVIDER_SITE_OTHER): Payer: Self-pay | Admitting: General Surgery

## 2013-10-23 ENCOUNTER — Encounter: Payer: Self-pay | Admitting: Nurse Practitioner

## 2013-10-23 DIAGNOSIS — Z9049 Acquired absence of other specified parts of digestive tract: Secondary | ICD-10-CM

## 2013-10-23 LAB — FACTOR 5 LEIDEN

## 2013-10-23 LAB — PROTHROMBIN GENE MUTATION

## 2013-10-23 MED ORDER — ONDANSETRON 4 MG PO TBDP
4.0000 mg | ORAL_TABLET | Freq: Four times a day (QID) | ORAL | Status: DC | PRN
Start: 2013-10-23 — End: 2013-11-11

## 2013-10-23 NOTE — Progress Notes (Signed)
Father called and stated the patient was having some n/v and pain. Dr. Ninfa Linden called him in some zofran and he has percocet for pain. Instructed father to begin the zofran and once the n/v is under control then proceed with pain medications. Fatther verbalized understanding. We will also contact him in the morning and see how he did through out the night.

## 2013-10-23 NOTE — Telephone Encounter (Signed)
Patient's father called in explaining that the patient is still having a lot of nausea and vomiting s/p appendectomy. He also has problems with blood clots and they have started his first dose of eliquis this morning.  He is still having abdominal pain that is left central with radiating pain upwards.  Describes as a constant cramping.  The are not using anything other than the percocet due to the pain but they believe that this medication is also playing a role in the nausea.  After paging Dr. Ninfa Linden he okay a script for the protocol Zofran 4 mg.  This was sent into their pharmacy.  I also instructed the patient's father that if the pain worsens then they will need to go back to the ER.

## 2013-10-24 ENCOUNTER — Telehealth: Payer: Self-pay | Admitting: Hematology & Oncology

## 2013-10-24 ENCOUNTER — Telehealth: Payer: Self-pay | Admitting: *Deleted

## 2013-10-24 NOTE — Telephone Encounter (Signed)
Spoke with pt about how he is feeling this morning. Pt stated that he is "feeling much better." Nurse asked pt if nausea and pain problems had resolved over night and pt stated they did. Nurse asked pt if he thought he may need some IV fluids to help him feel better. Pt declined IV fluids and stated that he "felt well enough not to receive IV fluids." Informed pt to call our office if any problems arise today or over the weekend.

## 2013-10-24 NOTE — Telephone Encounter (Signed)
Pt aware of 8-20 appointment

## 2013-10-24 NOTE — Telephone Encounter (Signed)
Left voicemail informing father to call office back and give update on how his son did over night and how he is this morning.

## 2013-11-06 ENCOUNTER — Ambulatory Visit (HOSPITAL_BASED_OUTPATIENT_CLINIC_OR_DEPARTMENT_OTHER): Payer: BC Managed Care – PPO | Admitting: Hematology & Oncology

## 2013-11-06 ENCOUNTER — Other Ambulatory Visit (HOSPITAL_BASED_OUTPATIENT_CLINIC_OR_DEPARTMENT_OTHER): Payer: BC Managed Care – PPO | Admitting: Lab

## 2013-11-06 ENCOUNTER — Encounter: Payer: Self-pay | Admitting: Hematology & Oncology

## 2013-11-06 VITALS — BP 112/50 | HR 82 | Temp 98.1°F | Resp 18 | Ht 69.0 in | Wt 278.0 lb

## 2013-11-06 DIAGNOSIS — IMO0002 Reserved for concepts with insufficient information to code with codable children: Principal | ICD-10-CM

## 2013-11-06 DIAGNOSIS — K55059 Acute (reversible) ischemia of intestine, part and extent unspecified: Secondary | ICD-10-CM

## 2013-11-06 DIAGNOSIS — K55069 Acute infarction of intestine, part and extent unspecified: Secondary | ICD-10-CM

## 2013-11-06 LAB — CBC WITH DIFFERENTIAL (CANCER CENTER ONLY)
BASO#: 0.1 10*3/uL (ref 0.0–0.2)
BASO%: 1.1 % (ref 0.0–2.0)
EOS%: 4.2 % (ref 0.0–7.0)
Eosinophils Absolute: 0.3 10*3/uL (ref 0.0–0.5)
HCT: 42.4 % (ref 38.7–49.9)
HGB: 14.3 g/dL (ref 13.0–17.1)
LYMPH#: 2.3 10*3/uL (ref 0.9–3.3)
LYMPH%: 31.4 % (ref 14.0–48.0)
MCH: 30.6 pg (ref 28.0–33.4)
MCHC: 33.7 g/dL (ref 32.0–35.9)
MCV: 91 fL (ref 82–98)
MONO#: 0.7 10*3/uL (ref 0.1–0.9)
MONO%: 9.6 % (ref 0.0–13.0)
NEUT#: 4 10*3/uL (ref 1.5–6.5)
NEUT%: 53.7 % (ref 40.0–80.0)
Platelets: 450 10*3/uL — ABNORMAL HIGH (ref 145–400)
RBC: 4.67 10*6/uL (ref 4.20–5.70)
RDW: 12.9 % (ref 11.1–15.7)
WBC: 7.4 10*3/uL (ref 4.0–10.0)

## 2013-11-06 LAB — CMP (CANCER CENTER ONLY)
ALBUMIN: 3.5 g/dL (ref 3.3–5.5)
ALK PHOS: 82 U/L (ref 26–84)
ALT: 112 U/L — AB (ref 10–47)
AST: 51 U/L — ABNORMAL HIGH (ref 11–38)
BILIRUBIN TOTAL: 0.5 mg/dL (ref 0.20–1.60)
BUN, Bld: 8 mg/dL (ref 7–22)
CO2: 29 meq/L (ref 18–33)
Calcium: 9.3 mg/dL (ref 8.0–10.3)
Chloride: 103 mEq/L (ref 98–108)
Creat: 0.6 mg/dl (ref 0.6–1.2)
Glucose, Bld: 86 mg/dL (ref 73–118)
POTASSIUM: 4.1 meq/L (ref 3.3–4.7)
SODIUM: 144 meq/L (ref 128–145)
TOTAL PROTEIN: 7.6 g/dL (ref 6.4–8.1)

## 2013-11-06 LAB — TECHNOLOGIST REVIEW CHCC SATELLITE

## 2013-11-06 NOTE — Progress Notes (Signed)
Hematology and Oncology Follow Up Visit  Jesse Underwood [id number] Jan 13, 1991 23 y.o. 11/06/2013   Principle Diagnosis:   Superior mesenteric vein thrombus  Prothrombin 2 gene mutation-heterozygous  Current Therapy:    ELIQUIS 5 mg by mouth twice a day     Interim History:  Mr.  Underwood is in for his first office visit. I saw him in the hospital on consultation. He presented with a superior mesenteric a thrombus. He had recently underwent an appendectomy.  His workup was negative except for the finding of a heterozygous mutation of the prothrombin 2 gene.  He has a very unusual vascular anatomy his abdomen. He has a short or stomp IVC. He has a lot of collateral vessels. There is some discrepancy as to whether or not there might be any swollen lymph nodes. I reviewed the scans with the radiologist and they don't there is any pathology that could be malignant.  He is doing well right now. He is not having any abdominal pain. He's had no bleeding.  He wants to see a holistic specialist about a blood thinner. He just does not like taking prescription medications. I talked to him about this. I told him that we do not know how effective any alternative blood mB. I would not want to see him have a rethrombosis of his mesenteric vein. I told him that he needs to be on liquids for at least 3 months before I would feel confident in him changing to a holistic approach.  He's had no problems with cough. He's had no rashes. He's had no headache. He has had no change in bowel or bladder habits.  Medications: Current outpatient prescriptions:acetaminophen (TYLENOL) 325 MG tablet, Take 2 tablets (650 mg total) by mouth every 6 (six) hours as needed for mild pain (or Temp > 100)., Disp: , Rfl: ;  apixaban (ELIQUIS) 5 MG TABS tablet, Take 1 tablet (5 mg total) by mouth 2 (two) times daily., Disp: 60 tablet, Rfl: 0 docusate sodium 100 MG CAPS, Take 100 mg by mouth 2 (two) times daily as needed for mild  constipation or moderate constipation., Disp: 10 capsule, Rfl: 0;  senna (SENOKOT) 8.6 MG TABS tablet, Take 1 tablet by mouth daily as needed for mild constipation., Disp: , Rfl: ;  ondansetron (ZOFRAN ODT) 4 MG disintegrating tablet, Take 1 tablet (4 mg total) by mouth every 6 (six) hours as needed for nausea or vomiting., Disp: 20 tablet, Rfl: 0 oxyCODONE-acetaminophen (PERCOCET/ROXICET) 5-325 MG per tablet, Take 1-2 tablets by mouth every 6 (six) hours as needed for moderate pain., Disp: 40 tablet, Rfl: 0  Allergies: No Known Allergies  Past Medical History, Surgical history, Social history, and Family History were reviewed and updated.  Review of Systems: As above  Physical Exam:  height is 5\' 9"  (1.753 m) and weight is 278 lb (126.1 kg). His oral temperature is 98.1 F (36.7 C). His blood pressure is 112/50 and his pulse is 82. His respiration is 18.   Obese white gentleman. Head and neck exam shows no ocular or oral lesions. He has no scleral icterus. There is no adenopathy in the neck. Lungs are clear. Cardiac exam regular in rhythm with no murmurs rubs or bruits. Abdomen is soft. He is a somewhat obese. He has good bowel sounds. There is no guarding or rebound tenderness. He has well-healed laparoscopy scars. There is no palpable liver or spleen tip. Back exam shows no tenderness over the spine ribs or hips. Extremities shows no  clubbing cyanosis or edema. He has good range of motion of his joints. Skin exam no rashes, and was or petechia. Neurological exam is nonfocal.  Lab Results  Component Value Date   WBC 7.4 11/06/2013   HGB 14.3 11/06/2013   HCT 42.4 11/06/2013   MCV 91 11/06/2013   PLT 450* 11/06/2013     Chemistry      Component Value Date/Time   NA 144 11/06/2013 1411   NA 138 10/18/2013 0325   K 4.1 11/06/2013 1411   K 3.9 10/18/2013 0325   CL 103 11/06/2013 1411   CL 100 10/18/2013 0325   CO2 29 11/06/2013 1411   CO2 24 10/18/2013 0325   BUN 8 11/06/2013 1411   BUN 5* 10/18/2013  0325   CREATININE 0.6 11/06/2013 1411   CREATININE 0.55 10/18/2013 0325      Component Value Date/Time   CALCIUM 9.3 11/06/2013 1411   CALCIUM 9.4 10/18/2013 0325   ALKPHOS 82 11/06/2013 1411   ALKPHOS 85 10/18/2013 0325   AST 51* 11/06/2013 1411   AST 28 10/18/2013 0325   ALT 112* 11/06/2013 1411   ALT 87* 10/18/2013 0325   BILITOT 0.50 11/06/2013 1411   BILITOT 0.5 10/18/2013 0325         Impression and Plan: Jesse Underwood is 23 year old gentleman. He has a superior mesenteric vein thrombus. He is heterozygous for the prothrombin 2 gene mutation.  I really feel strongly that at least 3 months of therapeutic anticoagulation will help. Again, after this, he is certainly can try something holistic if he wished.  His dad came with him. We had a nice talk. I explained what the prothrombin 2 gene mutation was all about.  He has a brother who also has had blood clots. I told his father that the patient's brother needs to be checked. He lives in [place].  I'll re\re evaluate Jesse Underwood with another MRI in October. This will be 3 months from the presentation of his initial thrombus. If the MRI looks good and the thrombus has resolved, then he certainly can try holistic agents. He still will be somewhat "unorthodox" but yet the patient just has a lot of concerns over prescription anticoagulants.  I will plan to see him back in October after his MRI is done.Marland Kitchen     Volanda Napoleon, MD 8/20/20154:56 PM

## 2013-11-11 ENCOUNTER — Encounter (INDEPENDENT_AMBULATORY_CARE_PROVIDER_SITE_OTHER): Payer: Self-pay | Admitting: Surgery

## 2013-11-11 ENCOUNTER — Ambulatory Visit (INDEPENDENT_AMBULATORY_CARE_PROVIDER_SITE_OTHER): Payer: BC Managed Care – PPO | Admitting: Surgery

## 2013-11-11 VITALS — BP 150/90 | HR 76 | Temp 98.3°F | Ht 68.0 in | Wt 276.0 lb

## 2013-11-11 DIAGNOSIS — Z09 Encounter for follow-up examination after completed treatment for conditions other than malignant neoplasm: Secondary | ICD-10-CM

## 2013-11-11 NOTE — Progress Notes (Signed)
Subjective:     Patient ID: Jesse Underwood, male   DOB: November 22, 1990, 23 y.o.   MRN: [id number]  HPI He is here for another postoperative visit. He is on anticoagulation now for his SMV thrombosis. He denies any abdominal pain. He is followed closely by hem/onc.  They are planning to repeat an MRI in October.  Review of Systems     Objective:   Physical Exam    on exam, he is well-appearing. He is actually lost weight. His abdomen is soft and nontender Assessment:     Patient stable status post appendectomy with findings of a thrombotic disorder and venous thrombosis intra-abdominally.     Plan:     He will continue to be followed by hematology. We will see him as needed. From a surgical standpoint, he may resume his normal activity.

## 2014-01-09 ENCOUNTER — Other Ambulatory Visit: Payer: Self-pay | Admitting: *Deleted

## 2014-01-09 DIAGNOSIS — K55069 Acute infarction of intestine, part and extent unspecified: Secondary | ICD-10-CM

## 2014-01-09 DIAGNOSIS — IMO0002 Reserved for concepts with insufficient information to code with codable children: Principal | ICD-10-CM

## 2014-01-09 MED ORDER — LORAZEPAM 0.5 MG PO TABS: 0.5000 mg | ORAL_TABLET | Freq: Once | ORAL | Status: DC

## 2014-01-12 ENCOUNTER — Ambulatory Visit: Payer: BC Managed Care – PPO

## 2014-01-12 DIAGNOSIS — K55069 Acute infarction of intestine, part and extent unspecified: Secondary | ICD-10-CM

## 2014-01-12 DIAGNOSIS — IMO0002 Reserved for concepts with insufficient information to code with codable children: Principal | ICD-10-CM

## 2014-01-12 MED ORDER — GADOBENATE DIMEGLUMINE 529 MG/ML IV SOLN
20.0000 mL | Freq: Once | INTRAVENOUS | Status: AC | PRN
  Administered 2014-01-12: 20 mL via INTRAVENOUS

## 2014-01-13 ENCOUNTER — Telehealth: Payer: Self-pay | Admitting: *Deleted

## 2014-01-13 NOTE — Telephone Encounter (Addendum)
Spoke with patient. He was pleased with results. He asked if he could stop taking meds. He was told to continue all medications as prescribed until his appointment this Friday, when he could review with Dr Marin Olp.   Message copied by Cordelia Poche on Tue Jan 13, 2014  9:31 AM ------      Message from: Volanda Napoleon      Created: Mon Jan 12, 2014  6:36 PM       Please call and tell him that the blood clot in his abdomen is now gone. In addition, the swollen lymph nodes in the abdomen are  also back to normal. Laurey Arrow ------

## 2014-01-16 ENCOUNTER — Ambulatory Visit (HOSPITAL_BASED_OUTPATIENT_CLINIC_OR_DEPARTMENT_OTHER): Payer: BC Managed Care – PPO | Admitting: Hematology & Oncology

## 2014-01-16 ENCOUNTER — Encounter: Payer: Self-pay | Admitting: Hematology & Oncology

## 2014-01-16 ENCOUNTER — Other Ambulatory Visit (HOSPITAL_BASED_OUTPATIENT_CLINIC_OR_DEPARTMENT_OTHER): Payer: BC Managed Care – PPO | Admitting: Lab

## 2014-01-16 VITALS — BP 121/68 | HR 73 | Temp 98.2°F | Resp 18 | Ht 68.0 in | Wt 287.0 lb

## 2014-01-16 DIAGNOSIS — K550 Acute vascular disorders of intestine: Secondary | ICD-10-CM

## 2014-01-16 DIAGNOSIS — D6852 Prothrombin gene mutation: Secondary | ICD-10-CM

## 2014-01-16 DIAGNOSIS — K55069 Acute infarction of intestine, part and extent unspecified: Secondary | ICD-10-CM

## 2014-01-16 DIAGNOSIS — N489 Disorder of penis, unspecified: Secondary | ICD-10-CM

## 2014-01-16 DIAGNOSIS — L02214 Cutaneous abscess of groin: Secondary | ICD-10-CM

## 2014-01-16 DIAGNOSIS — IMO0002 Reserved for concepts with insufficient information to code with codable children: Principal | ICD-10-CM

## 2014-01-16 LAB — CMP (CANCER CENTER ONLY)
ALK PHOS: 54 U/L (ref 26–84)
ALT(SGPT): 33 U/L (ref 10–47)
AST: 23 U/L (ref 11–38)
Albumin: 3.6 g/dL (ref 3.3–5.5)
BILIRUBIN TOTAL: 0.5 mg/dL (ref 0.20–1.60)
BUN: 9 mg/dL (ref 7–22)
CO2: 25 mEq/L (ref 18–33)
CREATININE: 0.6 mg/dL (ref 0.6–1.2)
Calcium: 9.1 mg/dL (ref 8.0–10.3)
Chloride: 104 mEq/L (ref 98–108)
Glucose, Bld: 102 mg/dL (ref 73–118)
Potassium: 3.8 mEq/L (ref 3.3–4.7)
Sodium: 141 mEq/L (ref 128–145)
Total Protein: 6.8 g/dL (ref 6.4–8.1)

## 2014-01-16 LAB — CBC WITH DIFFERENTIAL (CANCER CENTER ONLY)
BASO#: 0.1 10*3/uL (ref 0.0–0.2)
BASO%: 1.1 % (ref 0.0–2.0)
EOS%: 5.1 % (ref 0.0–7.0)
Eosinophils Absolute: 0.4 10*3/uL (ref 0.0–0.5)
HCT: 45.3 % (ref 38.7–49.9)
HGB: 15.8 g/dL (ref 13.0–17.1)
LYMPH#: 2.3 10*3/uL (ref 0.9–3.3)
LYMPH%: 32.8 % (ref 14.0–48.0)
MCH: 31 pg (ref 28.0–33.4)
MCHC: 34.9 g/dL (ref 32.0–35.9)
MCV: 89 fL (ref 82–98)
MONO#: 0.6 10*3/uL (ref 0.1–0.9)
MONO%: 8.8 % (ref 0.0–13.0)
NEUT#: 3.7 10*3/uL (ref 1.5–6.5)
NEUT%: 52.2 % (ref 40.0–80.0)
Platelets: 311 10*3/uL (ref 145–400)
RBC: 5.1 10*6/uL (ref 4.20–5.70)
RDW: 13.6 % (ref 11.1–15.7)
WBC: 7 10*3/uL (ref 4.0–10.0)

## 2014-01-16 MED ORDER — MUPIROCIN 2 % EX OINT: 1.0000 "application " | TOPICAL_OINTMENT | Freq: Two times a day (BID) | CUTANEOUS | Status: AC

## 2014-01-16 NOTE — Progress Notes (Signed)
Hematology and Oncology Follow Up Visit  KAHNE HELFAND [id number] [birth year] 23 y.o. 01/16/2014   Principle Diagnosis:   Superior mesenteric vein thrombus  Prothrombin 2 gene mutation-heterozygous  Current Therapy:    ELIQUIS 5 mg by mouth twice a day     Interim History:  Mr.  Jesse Underwood is in for his follow-up. We did go ahead and get a MRI of his abdomen. This was done last week. The MRI showed resolution of the superior mesenteric vein thrombus. 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 He has no abdominal pain. He does itch quite a lot. He has these folliculitis type lesions on his abdominal wall. It almost looks as if this is some type of allergic reaction.  He's had no bleeding. He's had no nausea or vomiting. He has no abdominal pain. His appetite has been good.  He's had no change in bowel or bladder habits.  He has noted a lesion on the tip of his penis. This is not pleuritic. It is not bleeding or draining.   Medications: Current outpatient prescriptions:acetaminophen (TYLENOL) 325 MG tablet, Take 2 tablets (650 mg total) by mouth every 6 (six) hours as needed for mild pain (or Temp > 100)., Disp: , Rfl: ;  mupirocin ointment (BACTROBAN) 2 %, Place 1 application into the nose 2 (two) times daily., Disp: 15 g, Rfl: 0  Allergies: No Known Allergies  Past Medical History, Surgical history, Social history, and Family History were reviewed and updated.  Review of Systems: As above  Physical Exam:  height is 5\' 8"  (1.727 m) and weight is 287 lb (130.182 kg). His oral temperature is 98.2 F (36.8 C). His blood pressure is 121/68 and his pulse is 73. His respiration is 18.   Obese white gentleman. Head and neck exam shows no ocular or oral lesions. He has no scleral icterus. There is no adenopathy in the neck. Lungs are clear. Cardiac exam regular in rhythm with no murmurs rubs or bruits. Abdomen is soft. He is a somewhat obese. He has good bowel sounds. There is  no guarding or rebound tenderness. He has well-healed laparoscopy scars. There is no palpable liver or spleen tip. Back exam shows no tenderness over the spine ribs or hips. Extremities shows no clubbing cyanosis or edema. He has good range of motion of his joints. Skin exam numerous punctate lesions on his abdominal wall. Some are dry. No exudate is noted. No inguinal adenopathy is appreciated Genital exam shows a small papular type lesion on the tip of his penis. It is nontender. No other abnormalities are noted..  Lab Results  Component Value Date   WBC 7.0 01/16/2014   HGB 15.8 01/16/2014   HCT 45.3 01/16/2014   MCV 89 01/16/2014   PLT 311 01/16/2014     Chemistry      Component Value Date/Time   NA 141 01/16/2014 1316   NA 138 10/18/2013 0325   K 3.8 01/16/2014 1316   K 3.9 10/18/2013 0325   CL 104 01/16/2014 1316   CL 100 10/18/2013 0325   CO2 25 01/16/2014 1316   CO2 24 10/18/2013 0325   BUN 9 01/16/2014 1316   BUN 5* 10/18/2013 0325   CREATININE 0.6 01/16/2014 1316   CREATININE 0.55 10/18/2013 0325      Component Value Date/Time   CALCIUM 9.1 01/16/2014 1316   CALCIUM 9.4 10/18/2013 0325   ALKPHOS 54 01/16/2014 1316   ALKPHOS 85 10/18/2013 0325   AST 23 01/16/2014 1316  AST 28 10/18/2013 0325   ALT 33 01/16/2014 1316   ALT 87* 10/18/2013 0325   BILITOT 0.50 01/16/2014 1316   BILITOT 0.5 10/18/2013 0325         Impression and Plan: Mr. Jesse Underwood is 23 year old gentleman. He has a superior mesenteric vein thrombus. He is heterozygous for the prothrombin 2 gene mutation.  He has completed 3 months of anticoagulation. Again, he really does not want to take anymore blood thinner. I think we can probably get him off ELIQUIS.  He does have the prothrombin 2 gene mutation. As such, he will be at risk for thromboembolic disease. He does not one to take any medication. I told him he can try garlic and fish oil supplements..   At this point, I think we can go from the clinic. I did give him a  prescription for Bactroban for this penile lesion. I told him that if it worsens, he will have to go to his family doctor and possibly see a urologist.  It was fine seen him. He is a nice sky. He is fine to talk to. Hopefully, he will not have any further thromboembolic events.   Volanda Napoleon, MD 10/30/20156:04 PM

## 2014-01-17 LAB — D-DIMER, QUANTITATIVE: D-Dimer, Quant: 0.46 ug/mL-FEU (ref 0.00–0.48)

## 2014-02-20 ENCOUNTER — Ambulatory Visit: Payer: BC Managed Care – PPO | Admitting: Family Medicine

## 2016-02-06 IMAGING — CT CT ABD-PELV W/ CM
2 of 4 series · 16 of 46 positions shown, 18 images · IV contrast (Omnipaque 300)
Comparison: None.

CLINICAL DATA: Right lower quadrant pain with lower abdominal
distension intermittent diarrhea and constipation next filled.

EXAM:
CT ABDOMEN AND PELVIS WITH CONTRAST
TECHNIQUE: Multidetector CT imaging of the abdomen and pelvis was performed
using the standard protocol following bolus administration of
intravenous contrast.
CONTRAST:  100mL OMNIPAQUE IOHEXOL 300 MG/ML SOLN intravenously ;
the patient also received oral contrast material.

[Series 2: abd/ pel 5mm · axial · 0.91mm/px · z∈[-506,-42]mm · 13 of 103 slices shown, 15 images]
[im 5/103  soft-tissue]
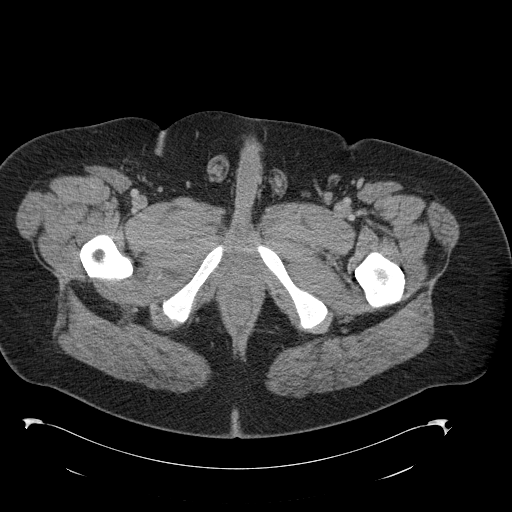
[im 5/103  bone]
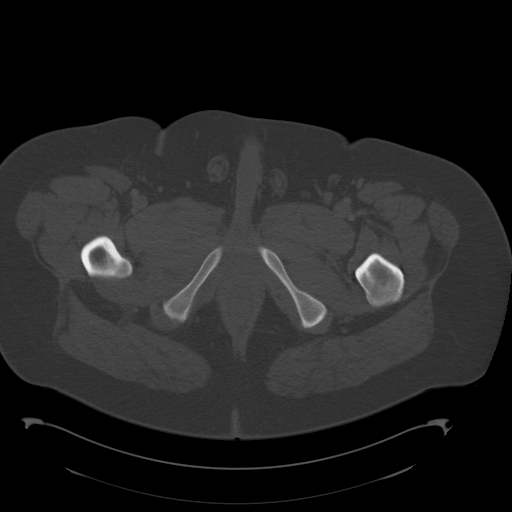
[im 13/103  soft-tissue]
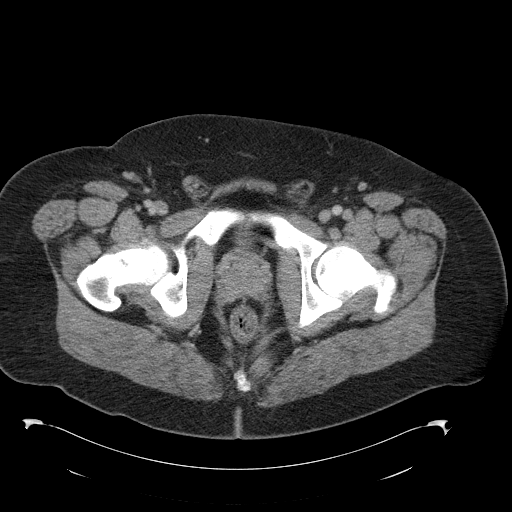
[im 22/103  soft-tissue]
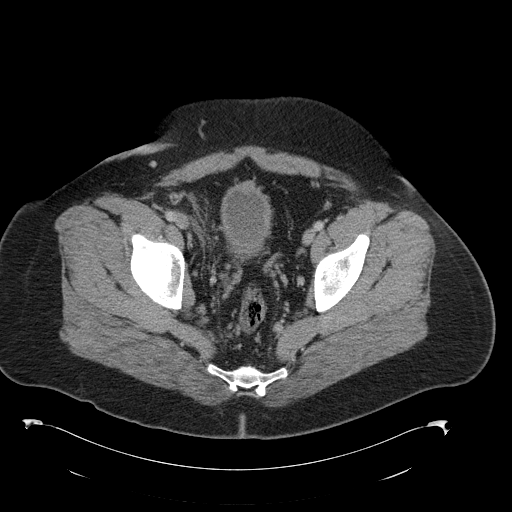
[im 30/103  soft-tissue]
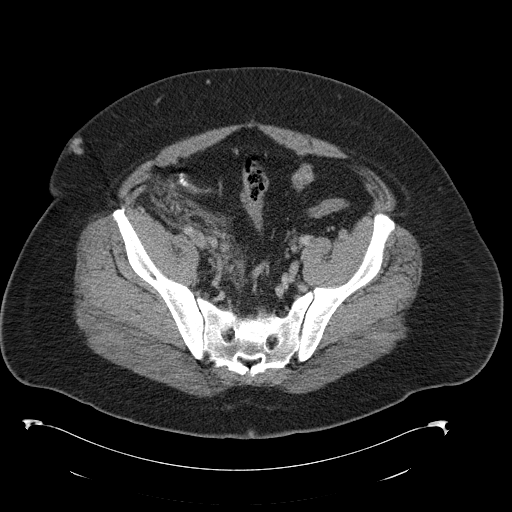
[im 35/103  soft-tissue]
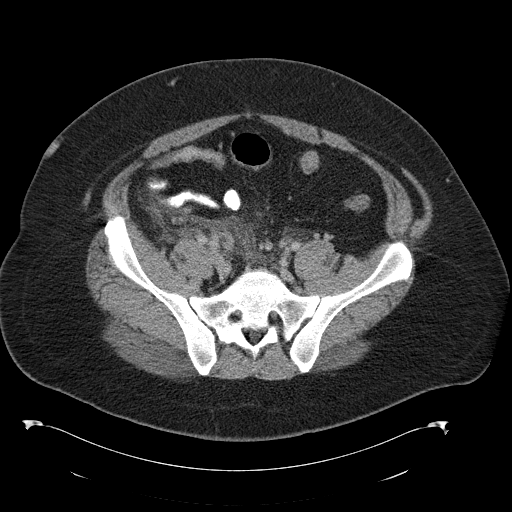
[im 43/103  soft-tissue]
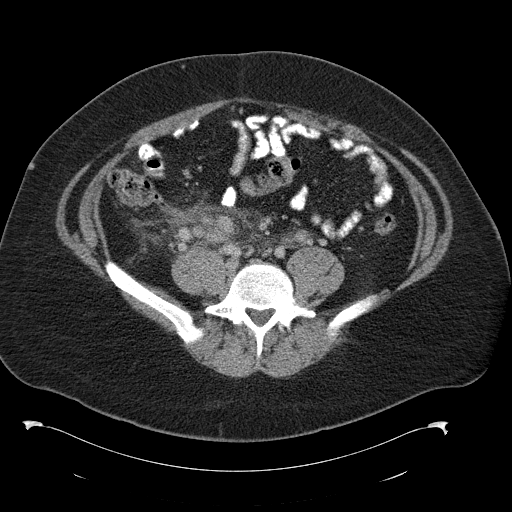
[im 52/103  soft-tissue]
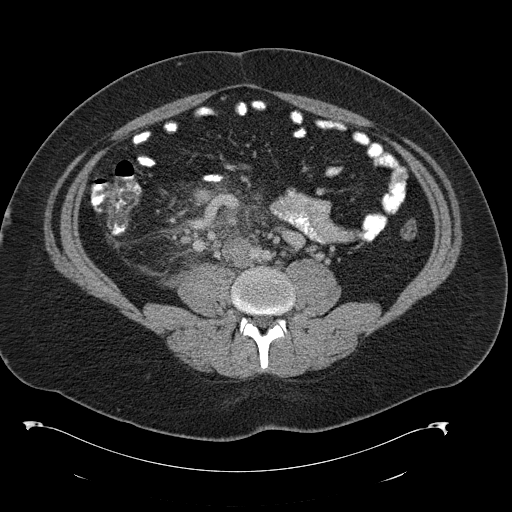
[im 60/103  soft-tissue]
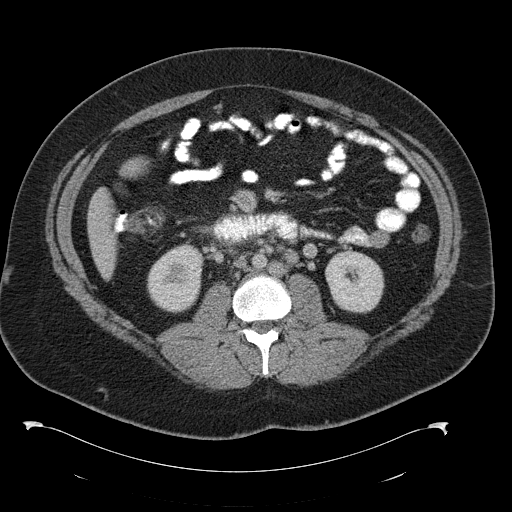
[im 69/103  soft-tissue]
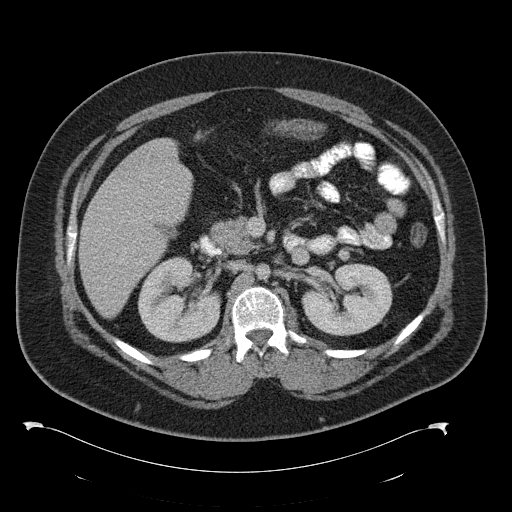
[im 69/103  bone]
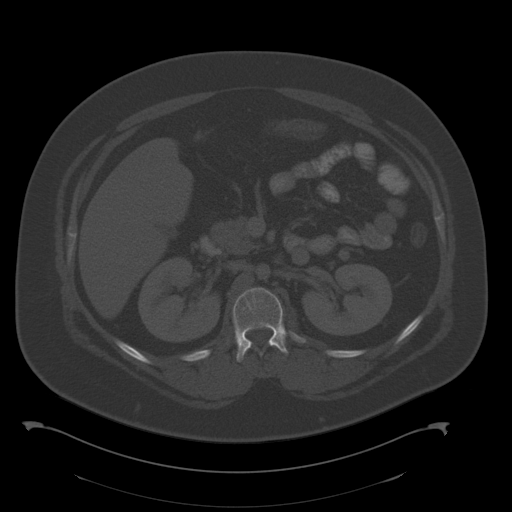
[im 73/103  soft-tissue]
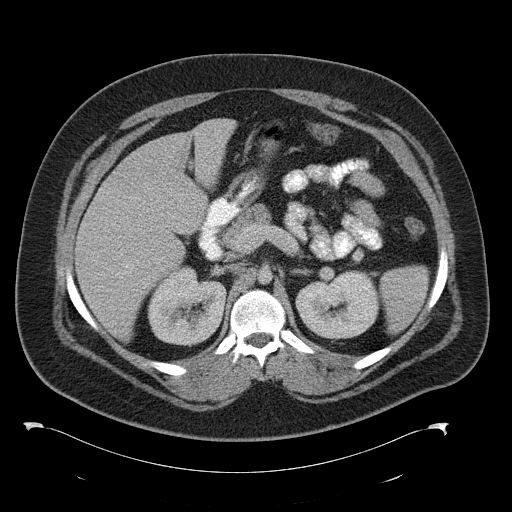
[im 81/103  soft-tissue]
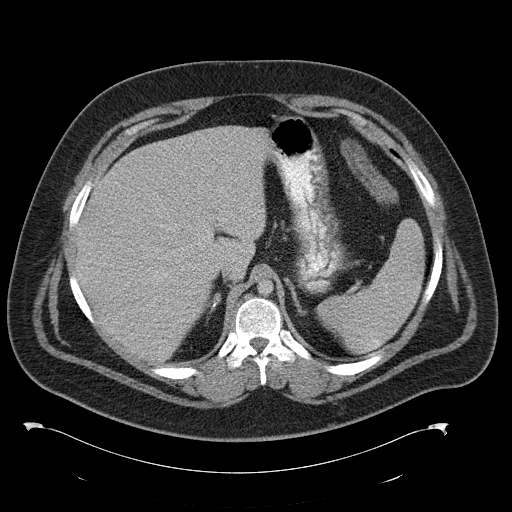
[im 90/103  soft-tissue]
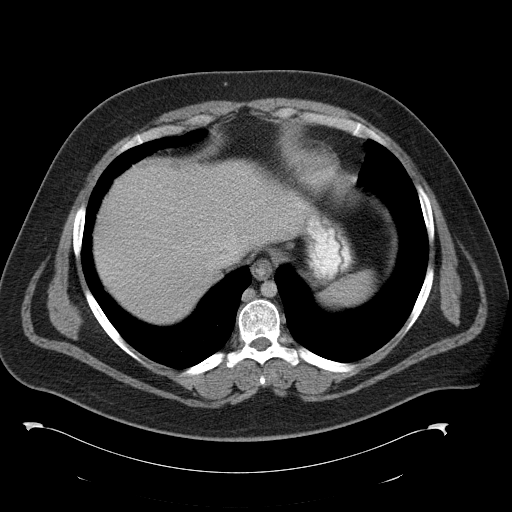
[im 98/103  soft-tissue]
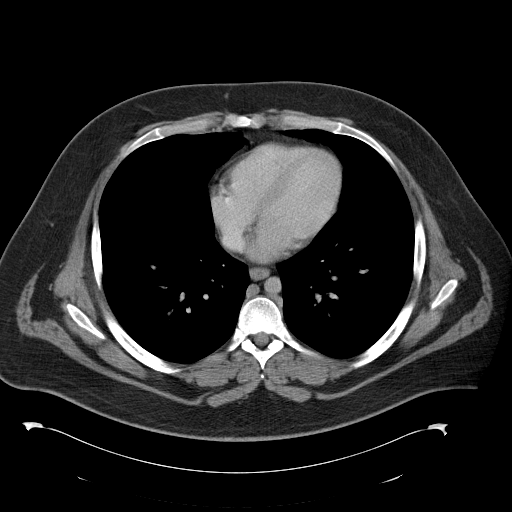

[Series 602: cor · coronal · 1.03mm/px · 3 of 141 slices shown]
[im 47/141  soft-tissue]
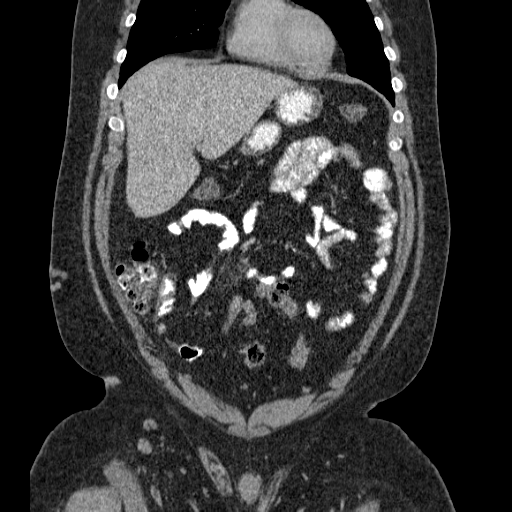
[im 63/141  soft-tissue]
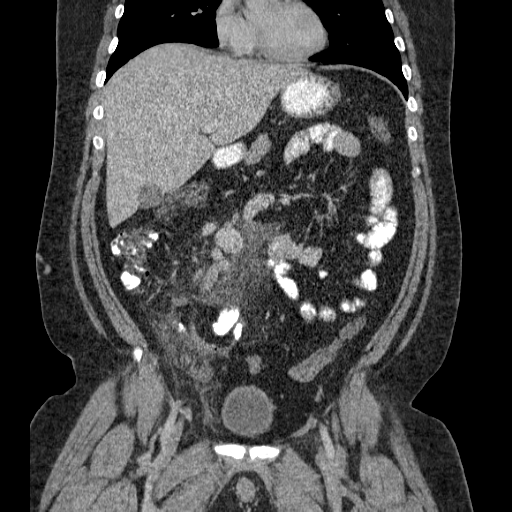
[im 78/141  soft-tissue]
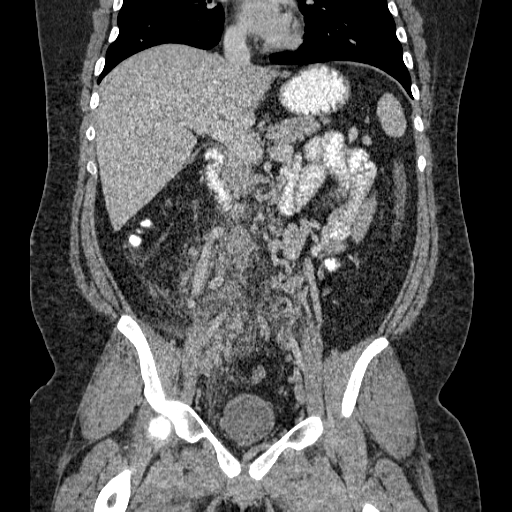

[16 of 46 positions shown; findings below may reference images not displayed]

FINDINGS: There are inflammatory changes of the right lower quadrant of the
abdomen. This is closely applied to the appendix which is not
markedly edematous. There is no appendicolith. The posterior aspect
of the distal portion of the appendix is closely applied to the
inflammatory changes. There is no discrete abscess nor evidence of
free air. Inflammatory changes extend proximally along the
mesenteric root and there are multiple enlarged mesenteric lymph
nodes and retroperitoneal lymph nodes. There are prominent
mesenteric vessels.

The transverse portion of duodenum exhibits minimal wall thickening
Rudi at the superior extent of the inflammatory changes. The
remaining bowel loops exhibit no evidence of inflammation,
obstruction, or ileus. The terminal ileum is unremarkable. Contrast
has just reached the cecum. The stool and gas pattern within the
colon is unremarkable.

The liver, gallbladder, pancreas, spleen, stomach, adrenal glands,
and kidneys exhibit no acute abnormalities. There is minimal
prominence of the right renal collecting system and proximal ureter.
The mid ureter becomes obscured by inflammatory changes in the
adjacent soft tissues. There are punctate right adrenal
calcifications. The urinary bladder and prostate gland are normal.
There is no free pelvic fluid. There is no inguinal nor umbilical
hernia.

The lumbar spine and bony pelvis are unremarkable. The lung bases
are clear.
IMPRESSION: 1. There are inflammatory changes in the right lower quadrant of the
abdomen. There is no abscess or free fluid but there are is
lymphadenopathy in the right lower quadrant and retroperitoneum.
There is also prominence of the mesenteric vasculature in this
region. This is likely secondary to atypical acute appendicitis, but
Crohn's disease, lymphoma, and granulomatous infectious are in the
differential.
2. There is no small or large bowel obstruction or ileus. There is
no acute colitis.
3. There is no acute hepatobiliary nor acute urinary tract
abnormality. Minimal prominence of the right intrarenal collecting
system and proximal ureter likely reflects involvement of the mid
right ureter by the retroperitoneal inflammatory process.
4. These results were called by telephone at the time of
interpretation on 10/01/2013 at [DATE] to Dr. JIASMIN SOLEMAN , who
verbally acknowledged these results.

## 2016-02-27 IMAGING — US US SCROTUM
1 series · 14 of 25 positions shown · non-contrast
Comparison: None.

CLINICAL DATA: Retroperitoneal adenopathy.

EXAM:
ULTRASOUND OF SCROTUM
TECHNIQUE: Complete ultrasound examination of the testicles, epididymis, and
other scrotal structures was performed.

[Series 1: us scrotum · 0.10mm/px · 14 of 48 slices shown]
[im 1/48]
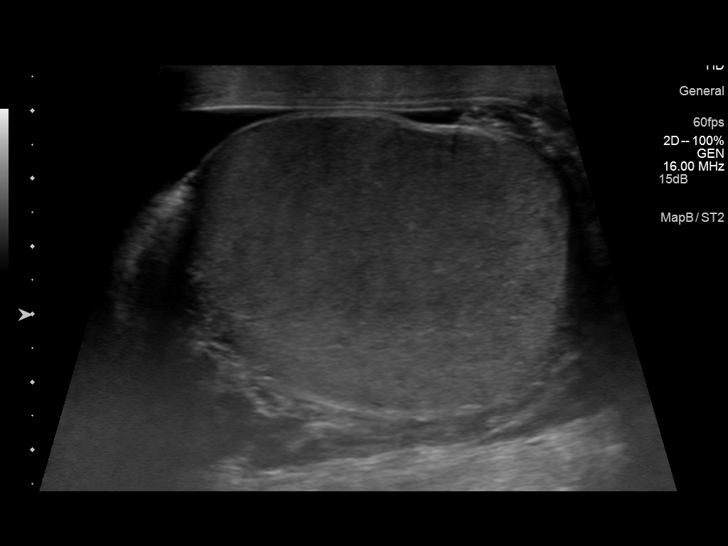
[im 4/48]
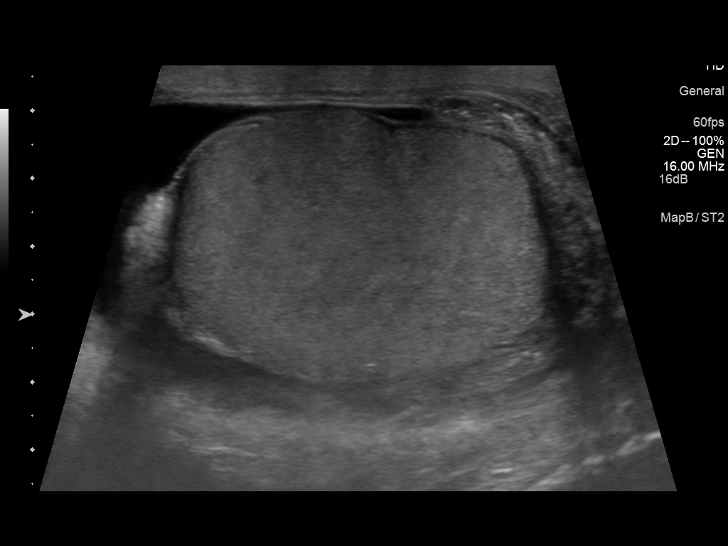
[im 8/48]
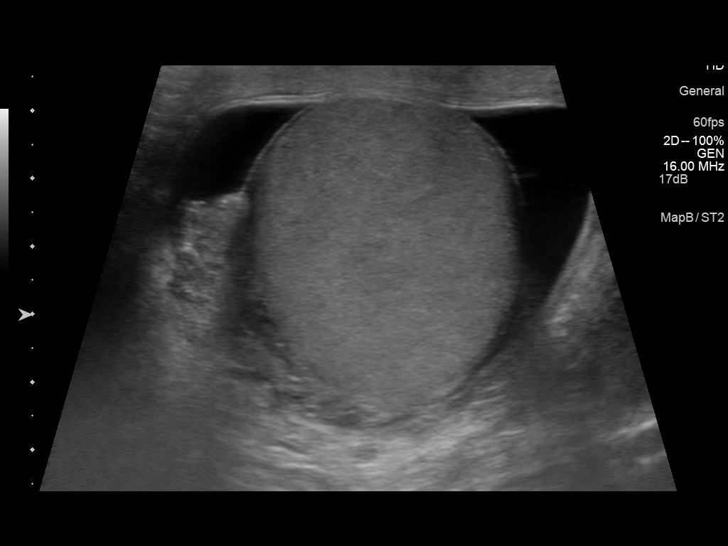
[im 12/48]
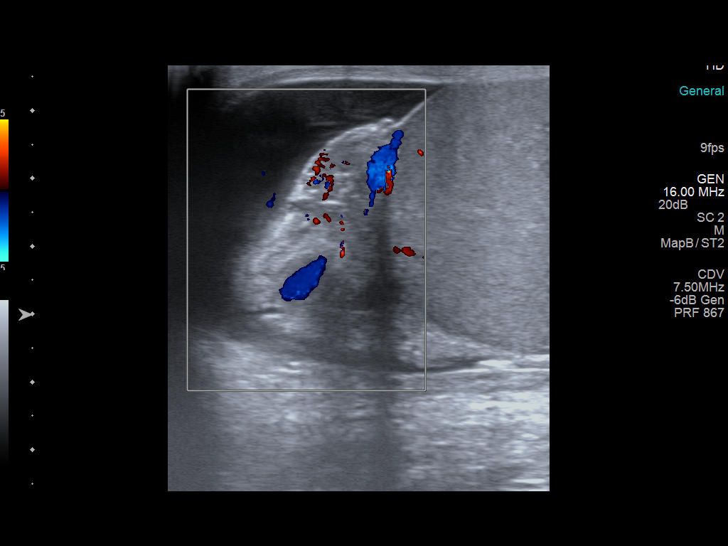
[im 16/48]
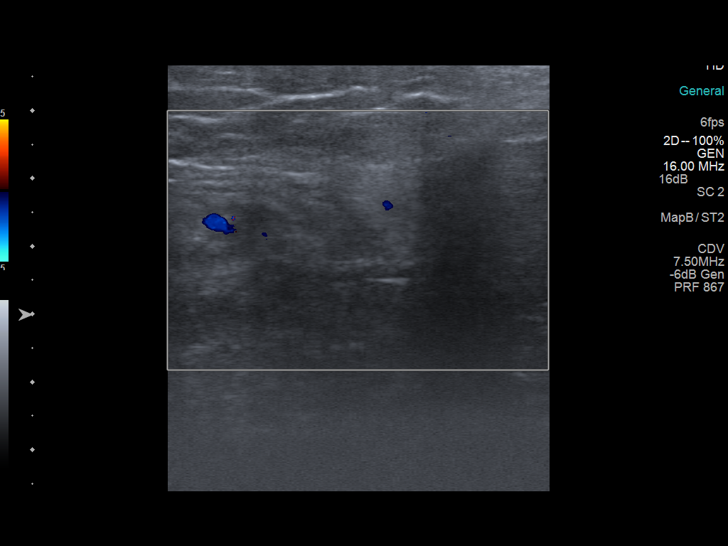
[im 18/48]
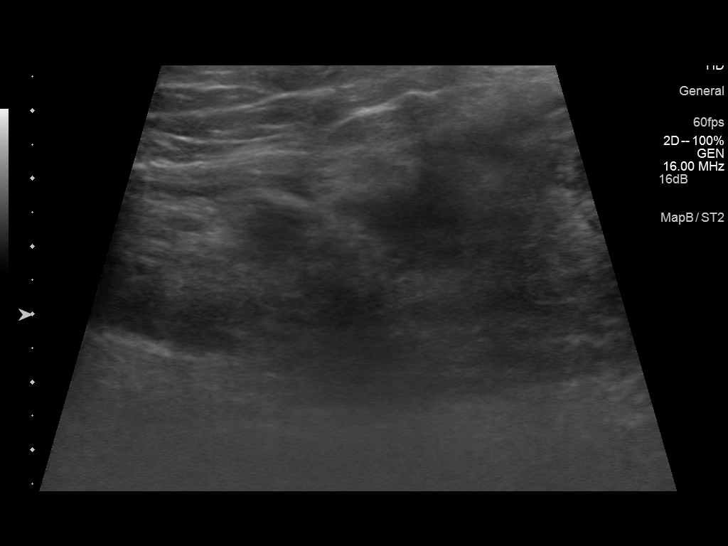
[im 22/48]
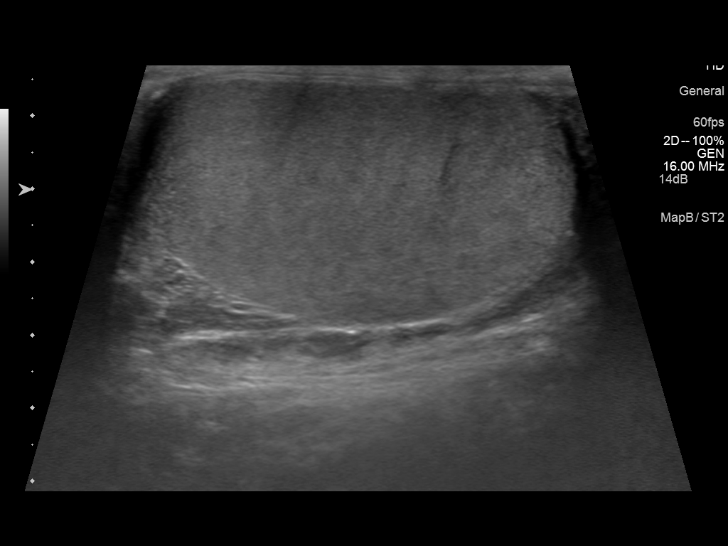
[im 26/48]
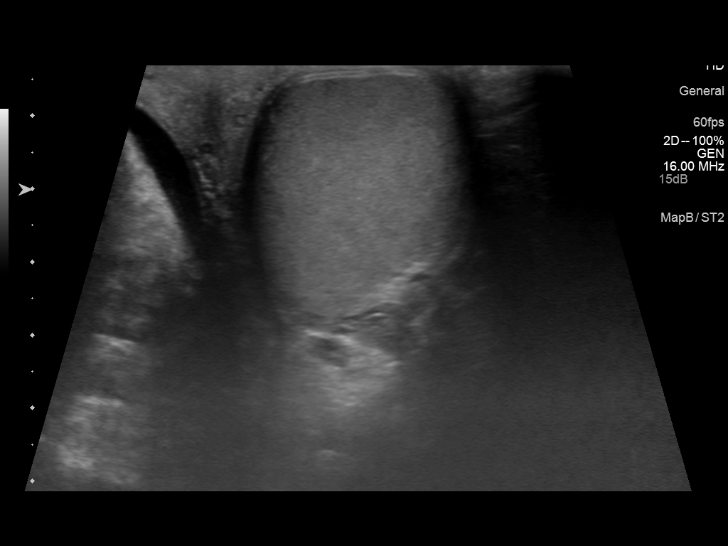
[im 30/48]
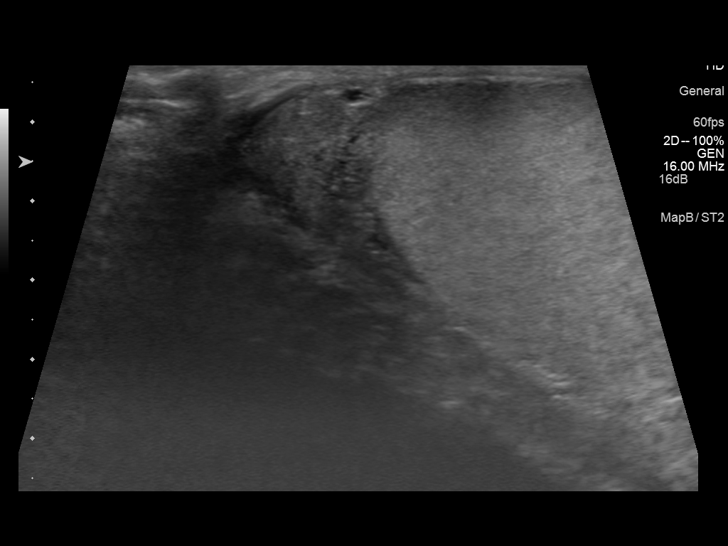
[im 32/48]
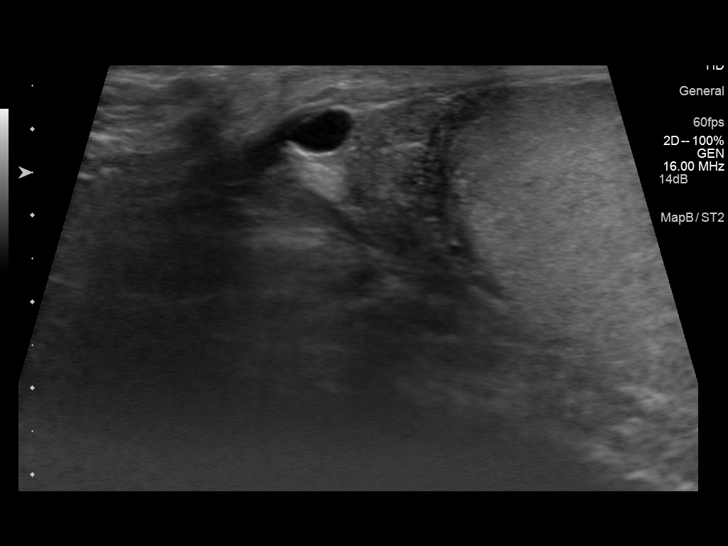
[im 36/48]
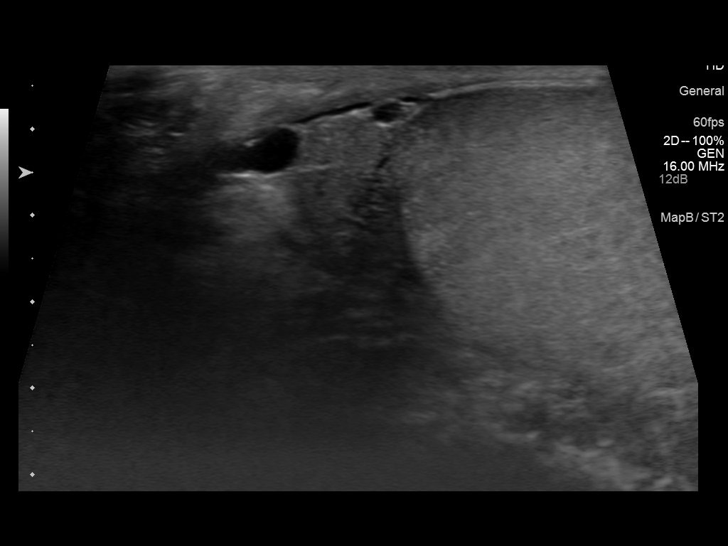
[im 40/48]
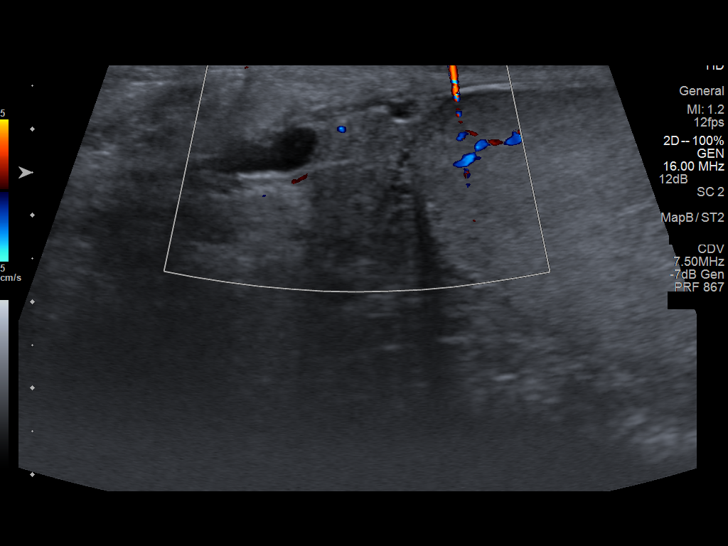
[im 44/48]
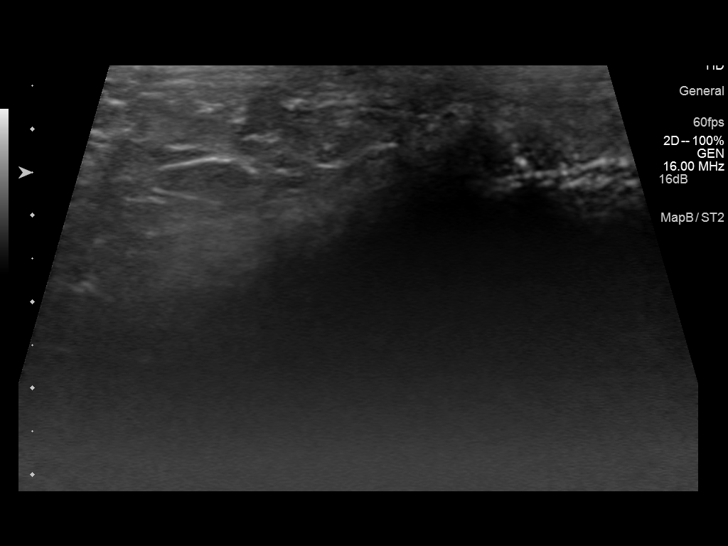
[im 48/48]
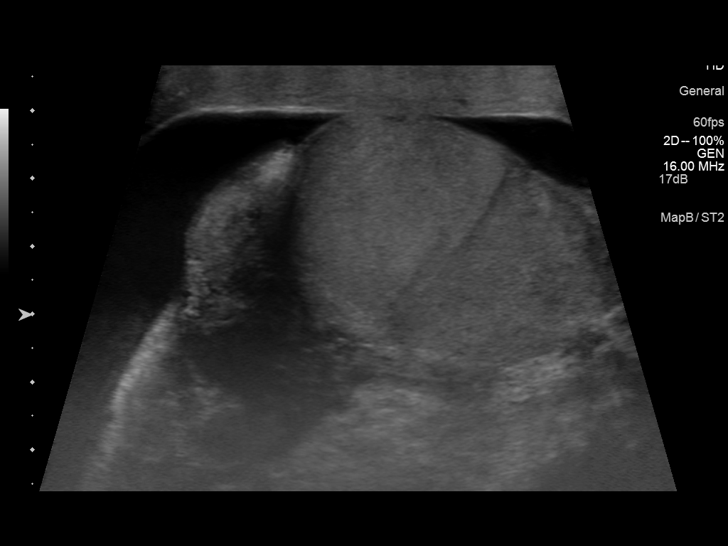

[14 of 25 positions shown; findings below may reference images not displayed]

FINDINGS: Right testicle

Measurements: 5.5 x 4.2 x 3.9 cm. No mass or microlithiasis
visualized.

Left testicle

Measurements: 5.8 x 3.3 x 3.3 cm. No mass or microlithiasis
visualized.

Right epididymis: The right epididymis is enlarged relative to the
left but shows no obvious focal mass lesion.

Left epididymis: Normal in size and appearance. 0.6 cm left
epididymal cyst appears benign.

Hydrocele:  There is a small right hydrocele.

Varicocele:  None visualized.
IMPRESSION: No evidence of testicular mass. Relative enlargement of the right
epididymis compared to the left is likely within normal limits but
may reflect some degree of epididymitis if the patient is focally
tender.
